# Patient Record
Sex: Male | Born: 1970 | Race: White | Hispanic: No | Marital: Married | Smoking: Never smoker
Health system: Southern US, Community
[De-identification: ages and names within clinical notes are randomized; demographics above are authoritative.]

## PROBLEM LIST (undated history)

## (undated) DIAGNOSIS — J9811 Atelectasis: Secondary | ICD-10-CM

## (undated) DIAGNOSIS — M1990 Unspecified osteoarthritis, unspecified site: Secondary | ICD-10-CM

## (undated) DIAGNOSIS — M109 Gout, unspecified: Secondary | ICD-10-CM

## (undated) DIAGNOSIS — K219 Gastro-esophageal reflux disease without esophagitis: Secondary | ICD-10-CM

## (undated) DIAGNOSIS — Z87442 Personal history of urinary calculi: Secondary | ICD-10-CM

## (undated) DIAGNOSIS — T7840XA Allergy, unspecified, initial encounter: Secondary | ICD-10-CM

## (undated) HISTORY — PX: NO PAST SURGERIES: SHX2092

---

## 2004-01-09 ENCOUNTER — Emergency Department: Payer: Self-pay | Admitting: Emergency Medicine

## 2004-03-10 ENCOUNTER — Encounter: Admission: RE | Admit: 2004-03-10 | Discharge: 2004-03-10 | Payer: Self-pay | Admitting: Family Medicine

## 2007-12-01 ENCOUNTER — Encounter: Admission: RE | Admit: 2007-12-01 | Discharge: 2007-12-01 | Payer: Self-pay | Admitting: Family Medicine

## 2008-12-30 ENCOUNTER — Encounter: Admission: RE | Admit: 2008-12-30 | Discharge: 2008-12-30 | Payer: Self-pay | Admitting: Family Medicine

## 2013-03-06 ENCOUNTER — Ambulatory Visit: Payer: Self-pay | Admitting: Orthopedic Surgery

## 2016-02-14 ENCOUNTER — Emergency Department: Payer: BLUE CROSS/BLUE SHIELD

## 2016-02-14 ENCOUNTER — Emergency Department
Admission: EM | Admit: 2016-02-14 | Discharge: 2016-02-14 | Disposition: A | Discharge status: Home / Self Care | Payer: BLUE CROSS/BLUE SHIELD | Attending: Emergency Medicine | Admitting: Emergency Medicine

## 2016-02-14 DIAGNOSIS — N201 Calculus of ureter: Secondary | ICD-10-CM | POA: Diagnosis not present

## 2016-02-14 DIAGNOSIS — R109 Unspecified abdominal pain: Secondary | ICD-10-CM

## 2016-02-14 DIAGNOSIS — Z79899 Other long term (current) drug therapy: Secondary | ICD-10-CM | POA: Diagnosis not present

## 2016-02-14 HISTORY — DX: Gastro-esophageal reflux disease without esophagitis: K21.9

## 2016-02-14 LAB — COMPREHENSIVE METABOLIC PANEL
ALT: 48 U/L (ref 17–63)
AST: 27 U/L (ref 15–41)
Albumin: 4 g/dL (ref 3.5–5.0)
Alkaline Phosphatase: 67 U/L (ref 38–126)
Anion gap: 8 (ref 5–15)
BUN: 12 mg/dL (ref 6–20)
CHLORIDE: 106 mmol/L (ref 101–111)
CO2: 26 mmol/L (ref 22–32)
CREATININE: 0.96 mg/dL (ref 0.61–1.24)
Calcium: 8.8 mg/dL — ABNORMAL LOW (ref 8.9–10.3)
GFR calc non Af Amer: >60 mL/min (ref >60)
Glucose, Bld: 120 mg/dL — ABNORMAL HIGH (ref 65–99)
Potassium: 3.3 mmol/L — ABNORMAL LOW (ref 3.5–5.1)
SODIUM: 140 mmol/L (ref 135–145)
Total Bilirubin: 0.9 mg/dL (ref 0.3–1.2)
Total Protein: 7.4 g/dL (ref 6.5–8.1)

## 2016-02-14 LAB — URINALYSIS, COMPLETE (UACMP) WITH MICROSCOPIC
BILIRUBIN URINE: NEGATIVE
Bacteria, UA: NONE SEEN
GLUCOSE, UA: NEGATIVE mg/dL
KETONES UR: NEGATIVE mg/dL
Nitrite: NEGATIVE
PH: 5 (ref 5.0–8.0)
Protein, ur: 30 mg/dL — AB
Specific Gravity, Urine: 1.028 (ref 1.005–1.030)

## 2016-02-14 LAB — CBC
HCT: 43.4 % (ref 40.0–52.0)
Hemoglobin: 15.4 g/dL (ref 13.0–18.0)
MCH: 33.3 pg (ref 26.0–34.0)
MCHC: 35.5 g/dL (ref 32.0–36.0)
MCV: 93.9 fL (ref 80.0–100.0)
PLATELETS: 194 10*3/uL (ref 150–440)
RBC: 4.62 MIL/uL (ref 4.40–5.90)
RDW: 13.3 % (ref 11.5–14.5)
WBC: 7.8 10*3/uL (ref 3.8–10.6)

## 2016-02-14 LAB — LIPASE, BLOOD: Lipase: 28 U/L (ref 11–51)

## 2016-02-14 MED ORDER — ONDANSETRON HCL 4 MG/2ML IJ SOLN
INTRAMUSCULAR | Status: AC
  Filled 2016-02-14: qty 2

## 2016-02-14 MED ORDER — KETOROLAC TROMETHAMINE 30 MG/ML IJ SOLN
INTRAMUSCULAR | Status: AC
  Filled 2016-02-14: qty 1

## 2016-02-14 MED ORDER — OXYCODONE-ACETAMINOPHEN 5-325 MG PO TABS: 1.0000 | ORAL_TABLET | Freq: Four times a day (QID) | ORAL | 0 refills | Status: AC | PRN

## 2016-02-14 MED ORDER — HYDROMORPHONE HCL 1 MG/ML IJ SOLN
1.0000 mg | Freq: Once | INTRAMUSCULAR | Status: AC
  Administered 2016-02-14: 1 mg via INTRAVENOUS
  Filled 2016-02-14: qty 1

## 2016-02-14 MED ORDER — MORPHINE SULFATE (PF) 4 MG/ML IV SOLN
INTRAVENOUS | Status: AC
  Filled 2016-02-14: qty 1

## 2016-02-14 MED ORDER — SODIUM CHLORIDE 0.9 % IV SOLN
1000.0000 mL | Freq: Once | INTRAVENOUS | Status: AC
  Administered 2016-02-14: 1000 mL via INTRAVENOUS

## 2016-02-14 MED ORDER — NAPROXEN 500 MG PO TABS: 500.0000 mg | ORAL_TABLET | Freq: Two times a day (BID) | ORAL | 2 refills | Status: DC

## 2016-02-14 MED ORDER — ONDANSETRON HCL 4 MG/2ML IJ SOLN
4.0000 mg | Freq: Once | INTRAMUSCULAR | Status: AC
  Administered 2016-02-14: 4 mg via INTRAVENOUS

## 2016-02-14 MED ORDER — KETOROLAC TROMETHAMINE 30 MG/ML IJ SOLN
30.0000 mg | Freq: Once | INTRAMUSCULAR | Status: AC
  Administered 2016-02-14: 30 mg via INTRAVENOUS

## 2016-02-14 MED ORDER — MORPHINE SULFATE (PF) 4 MG/ML IV SOLN
4.0000 mg | Freq: Once | INTRAVENOUS | Status: AC
  Administered 2016-02-14: 4 mg via INTRAVENOUS

## 2016-02-14 MED ORDER — ONDANSETRON HCL 4 MG PO TABS: 4.0000 mg | ORAL_TABLET | Freq: Every day | ORAL | 1 refills | Status: DC | PRN

## 2016-02-14 MED ORDER — TAMSULOSIN HCL 0.4 MG PO CAPS: 0.4000 mg | ORAL_CAPSULE | Freq: Every day | ORAL | 0 refills | Status: DC

## 2016-02-14 NOTE — ED Triage Notes (Signed)
Per EMS: Pt c/o LRQ pain radiating to groin. Pt states: "It feels like I got kicked in the balls". Pt attempted to drive to hospital and stopped at EMS center

## 2016-02-14 NOTE — ED Provider Notes (Signed)
[facility] Emergency Department Provider Note   ____________________________________________    I have reviewed the triage vital signs and the nursing notes.   HISTORY  Chief Complaint Abdominal Pain     HPI Tracy Villarreal is a 45 y.o. male who presents with complaints of right sided flank pain that radiates into his right groin. He reports his started around noon yesterday and has been nearly constant. He reports the pain has become so severe that he was unable to drive to the emergency department today. He has had nausea but not currently. No history of abdominal surgeries. No history of kidney stones. No dysuria. No fevers or chills.   Past Medical History:  Diagnosis Date  . GERD (gastroesophageal reflux disease)     There are no active problems to display for this patient.   History reviewed. No pertinent surgical history.  Prior to Admission medications   Medication Sig Start Date End Date Taking? Authorizing Provider  naproxen (NAPROSYN) 500 MG tablet Take 1 tablet (500 mg total) by mouth 2 (two) times daily with a meal. 02/14/16   Jene Everyobert Zannie Runkle, MD  ondansetron (ZOFRAN) 4 MG tablet Take 1 tablet (4 mg total) by mouth daily as needed for nausea or vomiting. 02/14/16   Jene Everyobert Shanon Becvar, MD  oxyCODONE-acetaminophen (ROXICET) 5-325 MG tablet Take 1 tablet by mouth every 6 (six) hours as needed. 02/14/16 02/13/17  Jene Everyobert Zaedyn Covin, MD  tamsulosin (FLOMAX) 0.4 MG CAPS capsule Take 1 capsule (0.4 mg total) by mouth daily. 02/14/16   Jene Everyobert Juergen Hardenbrook, MD     Allergies Patient has no known allergies.  No family history on file.  Social History Social History  Substance Use Topics  . Smoking status: Never Smoker  . Smokeless tobacco: Never Used  . Alcohol use 4.8 oz/week    8 Shots of liquor per week    Review of Systems  Constitutional: No fever/chills  Cardiovascular: Denies chest pain. Respiratory: Denies shortness of  breath. Gastrointestinal:As above Genitourinary: Negative for dysuria. Musculoskeletal: Negative for back pain. Skin: Negative for rash. Neurological: Negative for headaches  10-point ROS otherwise negative.  ____________________________________________   PHYSICAL EXAM:  VITAL SIGNS: ED Triage Vitals  Enc Vitals Group     BP 02/14/16 0408 (!) 150/98     Pulse Rate 02/14/16 0408 85     Resp --      Temp 02/14/16 0408 98.2 F (36.8 C)     Temp Source 02/14/16 0408 Oral     SpO2 02/14/16 0406 98 %     Weight 02/14/16 0410 215 lb (97.5 kg)     Height 02/14/16 0410 5\' 11"  (1.803 m)     Head Circumference --      Peak Flow --      Pain Score 02/14/16 0411 10     Pain Loc --      Pain Edu? --      Excl. in GC? --     Constitutional: Alert and oriented. Uncomfortable but in no acute distress Eyes: Conjunctivae are normal.   Nose: No congestion/rhinnorhea. Mouth/Throat: Mucous membranes are moist.    Cardiovascular: Normal rate, regular rhythm. Grossly normal heart sounds.  Good peripheral circulation. Respiratory: Normal respiratory effort.  No retractions. Lungs CTAB. Gastrointestinal: Soft and nontender. No distention.  No CVA tenderness. Genitourinary: deferred Musculoskeletal: No lower extremity tenderness nor edema.  Warm and well perfused Neurologic:  Normal speech and language. No gross focal neurologic deficits are appreciated.  Skin:  Skin  is warm, dry and intact. No rash noted. Psychiatric: Mood and affect are normal. Speech and behavior are normal.  ____________________________________________   LABS (all labs ordered are listed, but only abnormal results are displayed)  Labs Reviewed  COMPREHENSIVE METABOLIC PANEL - Abnormal; Notable for the following:       Result Value   Potassium 3.3 (*)    Glucose, Bld 120 (*)    Calcium 8.8 (*)    All other components within normal limits  URINALYSIS, COMPLETE (UACMP) WITH MICROSCOPIC - Abnormal; Notable for the  following:    Color, Urine YELLOW (*)    APPearance CLEAR (*)    Hgb urine dipstick SMALL (*)    Protein, ur 30 (*)    Leukocytes, UA TRACE (*)    Squamous Epithelial / LPF 0-5 (*)    All other components within normal limits  CBC  LIPASE, BLOOD   ____________________________________________  EKG  None ____________________________________________  RADIOLOGY  4 mm UVJ stone ____________________________________________   PROCEDURES  Procedure(s) performed: No    Critical Care performed: No ____________________________________________   INITIAL IMPRESSION / ASSESSMENT AND PLAN / ED COURSE  Pertinent labs & imaging results that were available during my care of the patient were reviewed by me and considered in my medical decision making (see chart for details).  Patient presents with right-sided abdominal pain radiating into his groin, I suspect ureterolithiasis. Appendicitis is also certainly possible. We will give IV Toradol and fluids. Obtain CT renal stone study and blood work and urinalysis and reevaluate  Clinical Course    ----------------------------------------- 6:22 AM on 02/14/2016 -----------------------------------------  Patient came in having pain, he was given IV morphine and IV Zofran. Even this did not significantly alleviate his pain, IV Dilaudid given which brought his pain from a 10 to a 5 or 6.however he reports it seems to be coming back again, he may require admission for pain control and urology consultation ____________________________________________   Patient reports he felt better after urinating, he is not interested in admission. He wants to go home with pain medications so I feel this is reasonable, normal white blood cell count, no fever no bacteria seen in urine. Return precautions discussed at length.  FINAL CLINICAL IMPRESSION(S) / ED DIAGNOSES  Final diagnoses:  Right flank pain  Ureterolithiasis      NEW MEDICATIONS STARTED  DURING THIS VISIT:  Discharge Medication List as of 02/14/2016  6:26 AM    START taking these medications   Details  naproxen (NAPROSYN) 500 MG tablet Take 1 tablet (500 mg total) by mouth 2 (two) times daily with a meal., Starting Sat 02/14/2016, Print    ondansetron (ZOFRAN) 4 MG tablet Take 1 tablet (4 mg total) by mouth daily as needed for nausea or vomiting., Starting Sat 02/14/2016, Print    oxyCODONE-acetaminophen (ROXICET) 5-325 MG tablet Take 1 tablet by mouth every 6 (six) hours as needed., Starting Sat 02/14/2016, Until Sun 02/13/2017, Print    tamsulosin (FLOMAX) 0.4 MG CAPS capsule Take 1 capsule (0.4 mg total) by mouth daily., Starting Sat 02/14/2016, Print         Note:  This document was prepared using Dragon voice recognition software and may include unintentional dictation errors.    Jene Everyobert Eylin Pontarelli, MD 02/15/16 [phone]

## 2016-02-17 ENCOUNTER — Encounter: Payer: Self-pay | Admitting: Urology

## 2016-02-17 ENCOUNTER — Ambulatory Visit (INDEPENDENT_AMBULATORY_CARE_PROVIDER_SITE_OTHER): Payer: BLUE CROSS/BLUE SHIELD | Admitting: Urology

## 2016-02-17 VITALS — BP 152/102 | HR 91 | Ht 71.0 in | Wt 212.1 lb

## 2016-02-17 DIAGNOSIS — N201 Calculus of ureter: Secondary | ICD-10-CM

## 2016-02-17 DIAGNOSIS — H9190 Unspecified hearing loss, unspecified ear: Secondary | ICD-10-CM | POA: Insufficient documentation

## 2016-02-17 DIAGNOSIS — M109 Gout, unspecified: Secondary | ICD-10-CM | POA: Insufficient documentation

## 2016-02-17 DIAGNOSIS — N1330 Unspecified hydronephrosis: Secondary | ICD-10-CM

## 2016-02-17 LAB — MICROSCOPIC EXAMINATION: Bacteria, UA: NONE SEEN

## 2016-02-17 LAB — URINALYSIS, COMPLETE
Bilirubin, UA: NEGATIVE
GLUCOSE, UA: NEGATIVE
Leukocytes, UA: NEGATIVE
NITRITE UA: NEGATIVE
PH UA: 5.5 (ref 5.0–7.5)
Specific Gravity, UA: 1.02 (ref 1.005–1.030)
UUROB: 0.2 mg/dL (ref 0.2–1.0)

## 2016-02-17 MED ORDER — HYDROCODONE-ACETAMINOPHEN 5-325 MG PO TABS: 1.0000 | ORAL_TABLET | Freq: Four times a day (QID) | ORAL | 0 refills | Status: DC | PRN

## 2016-02-17 MED ORDER — TAMSULOSIN HCL 0.4 MG PO CAPS: 0.4000 mg | ORAL_CAPSULE | Freq: Every day | ORAL | 0 refills | Status: DC

## 2016-02-17 NOTE — Progress Notes (Signed)
02/17/2016 2:25 PM   Maralyn Sagohristopher L Mcelhiney [birth year] [id number]  Referring provider: No referring provider defined for this encounter.  Chief Complaint  Patient presents with  . Nephrolithiasis    HPI: 45 year old male who presents to the office after recent emergency room visit on 02/14/2016 found to have an obstructing 4 mm right distal ureteral stone with mild hydroureteronephrosis. He initially developed severe onset right flank pain  Review of labs in the emergency room show 6-30 red blood cells as well as 6-30 white blood cells without bacteria present and his urinalysis. This was also nitrite negative.  Creatinine 0.96.  WBC 7.8.  He continues to have pain.  This is mostly well-controlled but can be severe at times. He has been using Percocet as needed.  He denies fevers or chills.  No vomiting but does have nausea.     No prior history of kidney stones.    PMH: Past Medical History:  Diagnosis Date  . GERD (gastroesophageal reflux disease)     Surgical History: History reviewed. No pertinent surgical history.  Home Medications:    Medication List       Accurate as of 02/17/16  2:25 PM. Always use your most recent med list.          HYDROcodone-acetaminophen 5-325 MG tablet Commonly known as:  NORCO/VICODIN Take 1-2 tablets by mouth every 6 (six) hours as needed for moderate pain.   naproxen 500 MG tablet Commonly known as:  NAPROSYN Take 1 tablet (500 mg total) by mouth 2 (two) times daily with a meal.   ondansetron 4 MG tablet Commonly known as:  ZOFRAN Take 1 tablet (4 mg total) by mouth daily as needed for nausea or vomiting.   oxyCODONE-acetaminophen 5-325 MG tablet Commonly known as:  ROXICET Take 1 tablet by mouth every 6 (six) hours as needed.   tamsulosin 0.4 MG Caps capsule Commonly known as:  FLOMAX Take 1 capsule (0.4 mg total) by mouth daily.       Allergies: No Known Allergies  Family History: Family History  Problem Relation  Age of Onset  . Prostate cancer Neg Hx   . Bladder Cancer Neg Hx   . Kidney cancer Neg Hx     Social History:  reports that he has never smoked. He has never used smokeless tobacco. He reports that he drinks about 4.8 oz of alcohol per week . He reports that he does not use drugs.  ROS: UROLOGY Frequent Urination?: No Hard to postpone urination?: No Burning/pain with urination?: No Get up at night to urinate?: Yes Leakage of urine?: No Urine stream starts and stops?: Yes Trouble starting stream?: Yes Do you have to strain to urinate?: No Blood in urine?: No Urinary tract infection?: No Sexually transmitted disease?: No Injury to kidneys or bladder?: No Painful intercourse?: No Weak stream?: No Erection problems?: No Penile pain?: No  Gastrointestinal Nausea?: Yes Vomiting?: No Indigestion/heartburn?: No Diarrhea?: No Constipation?: No  Constitutional Fever: No Night sweats?: Yes Weight loss?: No Fatigue?: No  Skin Skin rash/lesions?: No Itching?: No  Eyes Blurred vision?: No Double vision?: No  Ears/Nose/Throat Sore throat?: No Sinus problems?: No  Hematologic/Lymphatic Swollen glands?: No Easy bruising?: No  Cardiovascular Leg swelling?: No Chest pain?: No  Respiratory Cough?: Yes Shortness of breath?: No  Endocrine Excessive thirst?: No  Musculoskeletal Back pain?: No Joint pain?: No  Neurological Headaches?: No Dizziness?: No  Psychologic Depression?: No Anxiety?: No  Physical Exam: BP (!) 152/102 (BP Location: Right Arm,  Patient Position: Sitting, Cuff Size: Normal)   Pulse 91   Ht 5\' 11"  (1.803 m)   Wt 212 lb 1.6 oz (96.2 kg)   BMI 29.58 kg/m   Constitutional:  Alert and oriented, No acute distress. HEENT: [place] AT, moist mucus membranes.  Trachea midline, no masses. Cardiovascular: No clubbing, cyanosis, or edema. Respiratory: Normal respiratory effort, no increased work of breathing. GI: Abdomen is soft,  nondistended, no  abdominal masses, mild tenderness in the right lower quadrant. GU: No CVA tenderness.  Skin: No rashes, bruises or suspicious lesions. Neurologic: Grossly intact, no focal deficits, moving all 4 extremities. Psychiatric: Normal mood and affect.  Laboratory Data: Lab Results  Component Value Date   WBC 7.8 02/14/2016   HGB 15.4 02/14/2016   HCT 43.4 02/14/2016   MCV 93.9 02/14/2016   PLT 194 02/14/2016    Lab Results  Component Value Date   CREATININE 0.96 02/14/2016    Urinalysis Results for orders placed or performed in visit on 02/17/16  Microscopic Examination  Result Value Ref Range   WBC, UA 11-30 (A) 0 - 5 /hpf   RBC, UA 11-30 (A) 0 - 2 /hpf   Epithelial Cells (non renal) 0-10 0 - 10 /hpf   Casts Present (A) None seen /lpf   Cast Type Hyaline casts N/A   Crystals Present (A) N/A   Crystal Type Amorphous Sediment N/A   Mucus, UA Present (A) Not Estab.   Bacteria, UA None seen None seen/Few  Urinalysis, Complete  Result Value Ref Range   Specific Gravity, UA 1.020 1.005 - 1.030   pH, UA 5.5 5.0 - 7.5   Color, UA Yellow Yellow   Appearance Ur Cloudy (A) Clear   Leukocytes, UA Negative Negative   Protein, UA 1+ (A) Negative/Trace   Glucose, UA Negative Negative   Ketones, UA 2+ (A) Negative   RBC, UA 3+ (A) Negative   Bilirubin, UA Negative Negative   Urobilinogen, Ur 0.2 0.2 - 1.0 mg/dL   Nitrite, UA Negative Negative   Microscopic Examination See below:     Pertinent Imaging: CLINICAL DATA:  Initial evaluation for acute right flank pain radiating to groin.  EXAM: CT ABDOMEN AND PELVIS WITHOUT CONTRAST  TECHNIQUE: Multidetector CT imaging of the abdomen and pelvis was performed following the standard protocol without IV contrast.  COMPARISON:  None available.  FINDINGS: Lower chest: Minimal hazy atelectatic changes present within the visualized lung bases. Visualized lungs are otherwise clear.  Hepatobiliary: Few scattered subcentimeter  hypodense lesions noted within the liver, indeterminate, but may reflect small cyst. Hepatic steatosis noted. Liver otherwise unremarkable. Gallbladder within normal limits. No biliary dilatation.  Pancreas: Pancreas within normal limits.  Spleen: Spleen within normal limits.  Adrenals/Urinary Tract: Adrenal glands are normal. Left kidney unremarkable without evidence for nephrolithiasis or hydronephrosis. No radiopaque calculi seen along the course of the left renal collecting system. No left-sided hydroureter.  On the right, there is a 4 mm obstructive stone positioned just proximal to the right UVJ with secondary mild right hydroureteronephrosis. Mild hazy inflammatory stranding about the right ureter. No other stone seen along the course of the right ureter. Additional probable punctate calculus noted within the interpolar right kidney.  Bladder largely decompressed without acute abnormality.  Stomach/Bowel: Stomach within normal limits. No evidence for bowel obstruction. Appendix within normal limits. No acute inflammatory changes seen about the bowels.  Vascular/Lymphatic: Mild aorto bi-iliac atherosclerotic disease. No aneurysm. No adenopathy.  Reproductive: Prostate within normal limits.  Other:  No free air or fluid.  Musculoskeletal: No acute osseous abnormality. No worrisome lytic or blastic osseous lesions.  IMPRESSION: 1. 4 mm obstructive stone within the distal right ureter just proximal to the right UVJ with secondary mild right hydroureteronephrosis. 2. Probable additional punctate nonobstructive right renal calculus. 3. No other acute intra-abdominal or pelvic process. 4. Hepatic steatosis.   Electronically Signed   By: Rise Mu M.D.   On: 02/14/2016 04:42  CT scan personally reviewed today and with the patient.  Assessment & Plan:  45 year old male with a 4 mm right UVJ with proximal hydroureteronephrosis.  No  leukocytosis, elevated creatinine. UA some borderline but asymptomatic for UTI.  Clinically appears well.  1. Right ureteral stone We discussed given the size and location of the stone, he is an excellent probability of passing the stone without intervention. Alternatives including shockwave lithotripsy and ureteroscopy were discussed. Risk and benefits of each were discussed.  He would like to continue medical expulsive therapy. Refills of Flomax and Vicodin were given. He was also given a urinary strainer and advised the importance of using this regularly to catch the stone.  Warning symptoms were reviewed in detail. He will present to emergency room if this pain fails to be controlled or he develops fevers chills or any urinary symptoms.  We discussed general stone prevention techniques including drinking plenty water with goal of producing 2.5 L urine daily, increased citric acid intake, avoidance of high oxalate containing foods, and decreased salt intake.  Information about dietary recommendations given today.   All of his questions were answered.  2. Hydronephrosis, right Secondary to #1  Return in about 2 weeks (around 03/02/2016) for 2 weeks with KUB Carollee Herter or myself).  Vanna Scotland, MD  [facility] [address], [address] [place], [place] [address] [phone]

## 2016-02-17 NOTE — Patient Instructions (Signed)
Dietary Guidelines to Help Prevent Kidney Stones Your risk of kidney stones can be decreased by adjusting the foods you eat. The most important thing you can do is drink enough fluid. You should drink enough fluid to keep your urine clear or pale yellow. The following guidelines provide specific information for the type of kidney stone you have had. Guidelines according to type of kidney stone Calcium Oxalate Kidney Stones  Reduce the amount of salt you eat. Foods that have a lot of salt cause your body to release excess calcium into your urine. The excess calcium can combine with a substance called oxalate to form kidney stones.  Reduce the amount of animal protein you eat if the amount you eat is excessive. Animal protein causes your body to release excess calcium into your urine. Ask your dietitian how much protein from animal sources you should be eating.  Avoid foods that are high in oxalates. If you take vitamins, they should have less than 500 mg of vitamin C. Your body turns vitamin C into oxalates. You do not need to avoid fruits and vegetables high in vitamin C. Calcium Phosphate Kidney Stones  Reduce the amount of salt you eat to help prevent the release of excess calcium into your urine.  Reduce the amount of animal protein you eat if the amount you eat is excessive. Animal protein causes your body to release excess calcium into your urine. Ask your dietitian how much protein from animal sources you should be eating.  Get enough calcium from food or take a calcium supplement (ask your dietitian for recommendations). Food sources of calcium that do not increase your risk of kidney stones include:  Broccoli.  Dairy products, such as cheese and yogurt.  Pudding. Uric Acid Kidney Stones  Do not have more than 6 oz of animal protein per day. Food sources Animal Protein Sources  Meat (all types).  Poultry.  Eggs.  Fish, seafood. Foods High in Salt  Salt seasonings.  Soy  sauce.  Teriyaki sauce.  Cured and processed meats.  Salted crackers and snack foods.  Fast food.  Canned soups and most canned foods. Foods High in Oxalates  Grains:  Amaranth.  Barley.  Grits.  Wheat germ.  Bran.  Buckwheat flour.  All bran cereals.  Pretzels.  Whole wheat bread.  Vegetables:  Beans (wax).  Beets and beet greens.  Collard greens.  Eggplant.  Escarole.  Leeks.  Okra.  Parsley.  Rutabagas.  Spinach.  Swiss chard.  Tomato paste.  Fried potatoes.  Sweet potatoes.  Fruits:  Red currants.  Figs.  Kiwi.  Rhubarb.  Meat and Other Protein Sources:  Beans (dried).  Soy burgers and other soybean products.  Miso.  Nuts (peanuts, almonds, pecans, cashews, hazelnuts).  Nut butters.  Sesame seeds and tahini (paste made of sesame seeds).  Poppy seeds.  Beverages:  Chocolate drink mixes.  Soy milk.  Instant iced tea.  Juices made from high-oxalate fruits or vegetables.  Other:  Carob.  Chocolate.  Fruitcake.  Marmalades. This information is not intended to replace advice given to you by your health care provider. Make sure you discuss any questions you have with your health care provider. Document Released: 06/19/2010 Document Revised: 07/31/2015 Document Reviewed: 01/19/2013 Elsevier Interactive Patient Education  2017 [employer] Inc.  

## 2016-02-19 LAB — CULTURE, URINE COMPREHENSIVE

## 2016-02-20 ENCOUNTER — Ambulatory Visit: Payer: Self-pay | Admitting: Urology

## 2016-03-11 ENCOUNTER — Ambulatory Visit: Payer: BLUE CROSS/BLUE SHIELD | Admitting: Urology

## 2017-03-14 ENCOUNTER — Ambulatory Visit
Admission: RE | Admit: 2017-03-14 | Discharge: 2017-03-14 | Disposition: A | Discharge status: Home / Self Care | Payer: BLUE CROSS/BLUE SHIELD | Source: Ambulatory Visit | Attending: Family Medicine | Admitting: Family Medicine

## 2017-03-14 ENCOUNTER — Other Ambulatory Visit: Payer: Self-pay | Admitting: Family Medicine

## 2017-03-14 DIAGNOSIS — R52 Pain, unspecified: Secondary | ICD-10-CM

## 2017-12-01 ENCOUNTER — Encounter: Payer: Self-pay | Admitting: Surgery

## 2017-12-01 ENCOUNTER — Encounter: Payer: Self-pay | Admitting: *Deleted

## 2017-12-01 ENCOUNTER — Ambulatory Visit (INDEPENDENT_AMBULATORY_CARE_PROVIDER_SITE_OTHER): Payer: BLUE CROSS/BLUE SHIELD | Admitting: Surgery

## 2017-12-01 VITALS — BP 153/97 | HR 76 | Temp 97.9°F | Resp 18 | Ht 70.5 in | Wt 221.2 lb

## 2017-12-01 DIAGNOSIS — K429 Umbilical hernia without obstruction or gangrene: Secondary | ICD-10-CM | POA: Diagnosis not present

## 2017-12-01 NOTE — Patient Instructions (Signed)
You have requested for your Umbilical Hernia be repaired. This has been scheduled with  Dr. Satira Mccallum at [facility]. Please see date listed on surgery form.    You will need to arrange to be off work for 1-2 weeks but will have to have a lifting restriction of no more than 15 lbs for 6 weeks following your surgery.   Umbilical Hernia, Adult A hernia is a bulge of tissue that pushes through an opening between muscles. An umbilical hernia happens in the abdomen, near the belly button (umbilicus). The hernia may contain tissues from the small intestine, large intestine, or fatty tissue covering the intestines (omentum). Umbilical hernias in adults tend to get worse over time, and they require surgical treatment. There are several types of umbilical hernias. You may have:  A hernia located just above or below the umbilicus (indirect hernia). This is the most common type of umbilical hernia in adults.  A hernia that forms through an opening formed by the umbilicus (direct hernia).  A hernia that comes and goes (reducible hernia). A reducible hernia may be visible only when you strain, lift something heavy, or cough. This type of hernia can be pushed back into the abdomen (reduced).  A hernia that traps abdominal tissue inside the hernia (incarcerated hernia). This type of hernia cannot be reduced.  A hernia that cuts off blood flow to the tissues inside the hernia (strangulated hernia). The tissues can start to die if this happens. This type of hernia requires emergency treatment.  What are the causes? An umbilical hernia happens when tissue inside the abdomen presses on a weak area of the abdominal muscles. What increases the risk? You may have a greater risk of this condition if you:  Are obese.  Have had several pregnancies.  Have a buildup of fluid inside your abdomen (ascites).  Have had surgery that weakens the abdominal muscles.  What are the signs or symptoms? The main  symptom of this condition is a painless bulge at or near the belly button. A reducible hernia may be visible only when you strain, lift something heavy, or cough. Other symptoms may include:  Dull pain.  A feeling of pressure.  Symptoms of a strangulated hernia may include:  Pain that gets increasingly worse.  Nausea and vomiting.  Pain when pressing on the hernia.  Skin over the hernia becoming red or purple.  Constipation.  Blood in the stool.  How is this diagnosed? This condition may be diagnosed based on:  A physical exam. You may be asked to cough or strain while standing. These actions increase the pressure inside your abdomen and force the hernia through the opening in your muscles. Your health care provider may try to reduce the hernia by pressing on it.  Your symptoms and medical history.  How is this treated? Surgery is the only treatment for an umbilical hernia. Surgery for a strangulated hernia is done as soon as possible. If you have a small hernia that is not incarcerated, you may need to lose weight before having surgery. Follow these instructions at home:  Lose weight, if told by your health care provider.  Do not try to push the hernia back in.  Watch your hernia for any changes in color or size. Tell your health care provider if any changes occur.  You may need to avoid activities that increase pressure on your hernia.  Do not lift anything that is heavier than 10 lb (4.5 kg) until your health  care provider says that this is safe.  Take over-the-counter and prescription medicines only as told by your health care provider.  Keep all follow-up visits as told by your health care provider. This is important. Contact a health care provider if:  Your hernia gets larger.  Your hernia becomes painful. Get help right away if:  You develop sudden, severe pain near the area of your hernia.  You have pain as well as nausea or vomiting.  You have pain and  the skin over your hernia changes color.  You develop a fever. This information is not intended to replace advice given to you by your health care provider. Make sure you discuss any questions you have with your health care provider. Document Released: 07/25/2015 Document Revised: 10/26/2015 Document Reviewed: 07/25/2015 Elsevier Interactive Patient Education  [employer].

## 2017-12-01 NOTE — H&P (View-Only) (Signed)
Surgical Clinic History and Physical  Referring provider:  No referring provider defined for this encounter.  HISTORY OF PRESENT ILLNESS (HPI):  Tracy Villarreal presents for evaluation of his umbilical hernia. Patient reports he first noticed some burning umbilical pain 1 month ago, shortly after which he coughed while watching tv with his hand resting on his umbilicus and noticed his fingers rise on his umbilicus. Since that time, he's noticed an increasingly painful umbilical "bulge". 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 He mentioned this accordingly to his primary care physician and is referred for repair of his increasingly symptomatic umbilical hernia. Patient says his hernia continues to cause him moderate pain, particularly when lifting heavy machinery at work, and he expresses much anxiety about his worsening pain and the possibility of his hernia enlarging and causing him further problems. Patient otherwise reports +flatus and denies constipation, straining with urination, recent weight loss or weight gain, or frequent coughing.  PAST MEDICAL HISTORY (PMH):  Past Medical History:  Diagnosis Date  . GERD (gastroesophageal reflux disease)      PAST SURGICAL HISTORY (PSH):  History reviewed. No pertinent surgical history.   MEDICATIONS:  Prior to Admission medications   Medication Sig Start Date End Date Taking? Authorizing Provider  fluticasone (FLONASE) 50 MCG/ACT nasal spray SHAKE LQ AND U 1 TO 2 SPRAYS IEN QD PRF ALLERGIES 11/28/17  Yes [provider]     ALLERGIES:  No Known Allergies   SOCIAL HISTORY:  Social History   Socioeconomic History  . Marital status: Married    Spouse name: Not on file  . Number of children: Not on file  . Years of education: Not on file  .  Highest education level: Not on file  Occupational History  . Not on file  Social Needs  . Financial resource strain: Not on file  . Food insecurity:    Worry: Not on file    Inability: Not on file  . Transportation needs:    Medical: Not on file    Non-medical: Not on file  Tobacco Use  . Smoking status: Never Smoker  . Smokeless tobacco: Never Used  Substance and Sexual Activity  . Alcohol use: Yes    Alcohol/week: 8.0 standard drinks    Types: 8 Shots of liquor per week  . Drug use: No  . Sexual activity: Not on file  Lifestyle  . Physical activity:    Days per week: Not on file    Minutes per session: Not on file  . Stress: Not on file  Relationships  . Social connections:    Talks on phone: Not on file    Gets together: Not on file    Attends religious service: Not on file    Active member of club or organization: Not on file    Attends meetings of clubs or organizations: Not on file    Relationship status: Not on file  . Intimate partner violence:    Fear of current or ex partner: Not on file    Emotionally abused: Not on file    Physically abused: Not on file    Forced sexual activity: Not on file  Other Topics Concern  . Not on file  Social History Narrative  . Not on file    The patient currently resides (home /   rehab facility / nursing home): Home The patient normally is (ambulatory / bedbound): Ambulatory  FAMILY HISTORY:  Family History  Problem Relation Age of Onset  . Skin cancer Mother   . Cancer Sister   . Cancer Paternal Grandfather   . Prostate cancer Neg Hx   . Bladder Cancer Neg Hx   . Kidney cancer Neg Hx     Otherwise negative/non-contributory.  REVIEW OF SYSTEMS:  Constitutional: denies any other weight loss, fever, chills, or sweats  Eyes: denies any other vision changes, history of eye injury  ENT: denies sore throat, hearing problems  Respiratory: denies shortness of breath, wheezing  Cardiovascular: denies chest pain,  palpitations  Gastrointestinal: abdominal pain, N/V, and bowel function as per HPI Musculoskeletal: denies any other joint pains or cramps  Skin: Denies any other rashes or skin discolorations Neurological: denies any other headache, dizziness, weakness  Psychiatric: Denies any other depression, anxiety   All other review of systems were otherwise negative   VITAL SIGNS:  BP (!) 153/97   Pulse 76   Temp 97.9 F (36.6 C) (Temporal)   Resp 18   Ht 5' 10.5" (1.791 m)   Wt 221 lb 3.2 oz (100.3 kg)   SpO2 95%   BMI 31.29 kg/m    PHYSICAL EXAM:  Constitutional:  -- Overweight body habitus  -- Awake, alert, and oriented x3  Eyes:  -- Pupils equally round and reactive to light  -- No scleral icterus  Ear, nose, throat:  -- No jugular venous distension -- No nasal drainage, bleeding Pulmonary:  -- No crackles  -- Equal breath sounds bilaterally -- Breathing non-labored at rest Cardiovascular:  -- S1, S2 present  -- No pericardial rubs  Gastrointestinal:  -- Abdomen soft and non-distended with no guarding/rebound tenderness  -- Easily reducible and spontaneously self-reducing, but tender to palpation, umbilical hernia -- No other abdominal masses appreciated, pulsatile or otherwise  Musculoskeletal and Integumentary:  -- Wounds or skin discoloration: None appreciated -- Extremities: B/L UE and LE FROM, hands and feet warm, no edema  Neurologic:  -- Motor function: Intact and symmetric -- Sensation: Intact and symmetric  Labs:  CBC:  Lab Results  Component Value Date   WBC 7.8 02/14/2016   RBC 4.62 02/14/2016   BMP:  Lab Results  Component Value Date   GLUCOSE 120 (H) 02/14/2016   CO2 26 02/14/2016   BUN 12 02/14/2016   CREATININE 0.96 02/14/2016   CALCIUM 8.8 (L) 02/14/2016     Imaging studies: No new pertinent imaging studies available for review   Assessment/Plan: (ICD-10's: K42.9) Tracy Villarreal with increasingly asymptomatic easily reducible  supra-umbilical hernia, complicated by pertinent comorbidities including obesity (BMI >31) and GERD.               - discussed with patient signs and symptoms of hernia incarceration and obstruction             - strategies for manual self-reduction of patient's umbilical hernia also reviewed and discussed  - maintain hydration with high fiber heart healthy diet to reduce/minimize constipation +/- daily stool softener as needed             - all risks, benefits, and alternatives to open repair of increasingly symptomatic supra-umbilical hernia with mesh were discussed with the patient, all of his questions were answered to patient's expressed satisfaction, patient expresses he wishes to proceed, and informed consent was obtained.             -   will plan for open repair of supra-umbilical hernia with mesh next week/on 10/4 per patient request             - anticipate return to clinic 2 weeks after above planned surgery             - instructed to call if any questions or concerns  All of the above recommendations were discussed with the patient, and all of patient's questions were answered to his expressed satisfaction.  Thank you for the opportunity to participate in this patient's care.  -- Nicol Herbig E. Callicott, MD, RPVI [place]: [facility] General Surgery - Partnering for exceptional care. Office: [phone] 

## 2017-12-01 NOTE — Progress Notes (Signed)
Patient's surgery has been scheduled for 12-09-17 at [facility] with Dr. Earlene Plater.   He is aware that the Pre-admission Testing Department will be calling on 12-05-17 in the afternoon to complete a phone interview.   The patient is aware to call the office if he has further questions.

## 2017-12-01 NOTE — Progress Notes (Signed)
Surgical Clinic History and Physical  Referring provider:  No referring provider defined for this encounter.  HISTORY OF PRESENT ILLNESS (HPI):  47 y.o. male presents for evaluation of his umbilical hernia. Patient reports he first noticed some burning umbilical pain 1 month ago, shortly after which he coughed while watching tv with his hand resting on his umbilicus and noticed his fingers rise on his umbilicus. Since that time, he's noticed an increasingly painful umbilical "bulge". 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 He mentioned this accordingly to his primary care physician and is referred for repair of his increasingly symptomatic umbilical hernia. Patient says his hernia continues to cause him moderate pain, particularly when lifting heavy machinery at work, and he expresses much anxiety about his worsening pain and the possibility of his hernia enlarging and causing him further problems. Patient otherwise reports +flatus and denies constipation, straining with urination, recent weight loss or weight gain, or frequent coughing.  PAST MEDICAL HISTORY (PMH):  Past Medical History:  Diagnosis Date  . GERD (gastroesophageal reflux disease)      PAST SURGICAL HISTORY (PSH):  History reviewed. No pertinent surgical history.   MEDICATIONS:  Prior to Admission medications   Medication Sig Start Date End Date Taking? Authorizing Provider  fluticasone (FLONASE) 50 MCG/ACT nasal spray SHAKE LQ AND U 1 TO 2 SPRAYS IEN QD PRF ALLERGIES 11/28/17  Yes [provider]     ALLERGIES:  No Known Allergies   SOCIAL HISTORY:  Social History   Socioeconomic History  . Marital status: Married    Spouse name: Not on file  . Number of children: Not on file  . Years of education: Not on file  .  Highest education level: Not on file  Occupational History  . Not on file  Social Needs  . Financial resource strain: Not on file  . Food insecurity:    Worry: Not on file    Inability: Not on file  . Transportation needs:    Medical: Not on file    Non-medical: Not on file  Tobacco Use  . Smoking status: Never Smoker  . Smokeless tobacco: Never Used  Substance and Sexual Activity  . Alcohol use: Yes    Alcohol/week: 8.0 standard drinks    Types: 8 Shots of liquor per week  . Drug use: No  . Sexual activity: Not on file  Lifestyle  . Physical activity:    Days per week: Not on file    Minutes per session: Not on file  . Stress: Not on file  Relationships  . Social connections:    Talks on phone: Not on file    Gets together: Not on file    Attends religious service: Not on file    Active member of club or organization: Not on file    Attends meetings of clubs or organizations: Not on file    Relationship status: Not on file  . Intimate partner violence:    Fear of current or ex partner: Not on file    Emotionally abused: Not on file    Physically abused: Not on file    Forced sexual activity: Not on file  Other Topics Concern  . Not on file  Social History Narrative  . Not on file    The patient currently resides (home /  rehab facility / nursing home): Home The patient normally is (ambulatory / bedbound): Ambulatory  FAMILY HISTORY:  Family History  Problem Relation Age of Onset  . Skin cancer Mother   . Cancer Sister   . Cancer Paternal Grandfather   . Prostate cancer Neg Hx   . Bladder Cancer Neg Hx   . Kidney cancer Neg Hx     Otherwise negative/non-contributory.  REVIEW OF SYSTEMS:  Constitutional: denies any other weight loss, fever, chills, or sweats  Eyes: denies any other vision changes, history of eye injury  ENT: denies sore throat, hearing problems  Respiratory: denies shortness of breath, wheezing  Cardiovascular: denies chest pain,  palpitations  Gastrointestinal: abdominal pain, N/V, and bowel function as per HPI Musculoskeletal: denies any other joint pains or cramps  Skin: Denies any other rashes or skin discolorations Neurological: denies any other headache, dizziness, weakness  Psychiatric: Denies any other depression, anxiety   All other review of systems were otherwise negative   VITAL SIGNS:  BP (!) 153/97   Pulse 76   Temp 97.9 F (36.6 C) (Temporal)   Resp 18   Ht 5' 10.5" (1.791 m)   Wt 221 lb 3.2 oz (100.3 kg)   SpO2 95%   BMI 31.29 kg/m    PHYSICAL EXAM:  Constitutional:  -- Overweight body habitus  -- Awake, alert, and oriented x3  Eyes:  -- Pupils equally round and reactive to light  -- No scleral icterus  Ear, nose, throat:  -- No jugular venous distension -- No nasal drainage, bleeding Pulmonary:  -- No crackles  -- Equal breath sounds bilaterally -- Breathing non-labored at rest Cardiovascular:  -- S1, S2 present  -- No pericardial rubs  Gastrointestinal:  -- Abdomen soft and non-distended with no guarding/rebound tenderness  -- Easily reducible and spontaneously self-reducing, but tender to palpation, umbilical hernia -- No other abdominal masses appreciated, pulsatile or otherwise  Musculoskeletal and Integumentary:  -- Wounds or skin discoloration: None appreciated -- Extremities: B/L UE and LE FROM, hands and feet warm, no edema  Neurologic:  -- Motor function: Intact and symmetric -- Sensation: Intact and symmetric  Labs:  CBC:  Lab Results  Component Value Date   WBC 7.8 02/14/2016   RBC 4.62 02/14/2016   BMP:  Lab Results  Component Value Date   GLUCOSE 120 (H) 02/14/2016   CO2 26 02/14/2016   BUN 12 02/14/2016   CREATININE 0.96 02/14/2016   CALCIUM 8.8 (L) 02/14/2016     Imaging studies: No new pertinent imaging studies available for review   Assessment/Plan: (ICD-10's: K67.9) 47 y.o. male with increasingly asymptomatic easily reducible  supra-umbilical hernia, complicated by pertinent comorbidities including obesity (BMI >31) and GERD.               - discussed with patient signs and symptoms of hernia incarceration and obstruction             - strategies for manual self-reduction of patient's umbilical hernia also reviewed and discussed  - maintain hydration with high fiber heart healthy diet to reduce/minimize constipation +/- daily stool softener as needed             - all risks, benefits, and alternatives to open repair of increasingly symptomatic supra-umbilical hernia with mesh were discussed with the patient, all of his questions were answered to patient's expressed satisfaction, patient expresses he wishes to proceed, and informed consent was obtained.             -  will plan for open repair of supra-umbilical hernia with mesh next week/on 10/4 per patient request             - anticipate return to clinic 2 weeks after above planned surgery             - instructed to call if any questions or concerns  All of the above recommendations were discussed with the patient, and all of patient's questions were answered to his expressed satisfaction.  Thank you for the opportunity to participate in this patient's care.  -- Scherrie Gerlach Earlene Plater, MD, RPVI [place]: [facility] General Surgery - Partnering for exceptional care. Office: [phone]

## 2017-12-05 ENCOUNTER — Other Ambulatory Visit: Payer: Self-pay

## 2017-12-05 ENCOUNTER — Encounter
Admission: RE | Admit: 2017-12-05 | Discharge: 2017-12-05 | Disposition: A | Discharge status: Home / Self Care | Payer: BLUE CROSS/BLUE SHIELD | Source: Ambulatory Visit | Attending: Surgery | Admitting: Surgery

## 2017-12-05 HISTORY — DX: Unspecified osteoarthritis, unspecified site: M19.90

## 2017-12-05 HISTORY — DX: Personal history of urinary calculi: Z87.442

## 2017-12-05 HISTORY — DX: Atelectasis: J98.11

## 2017-12-05 NOTE — Patient Instructions (Signed)
Your procedure is scheduled on: 12-09-17 FRIDAY Report to Same Day Surgery 2nd floor medical mall [facility] Entrance-take elevator on left to 2nd floor.  Check in with surgery information desk.) To find out your arrival time please call [phone] between 1PM - 3PM on 12-08-17 THURSDAY  Remember: Instructions that are not followed completely may result in serious medical risk, up to and including death, or upon the discretion of your surgeon and anesthesiologist your surgery may need to be rescheduled.    _x___ 1. Do not eat food after midnight the night before your procedure. NO GUM OR CANDY AFTER MIDNIGHT.  You may drink clear liquids up to 2 hours before you are scheduled to arrive at the hospital for your procedure.  Do not drink clear liquids within 2 hours of your scheduled arrival to the hospital.  Clear liquids include  --Water or Apple juice without pulp  --Clear carbohydrate beverage such as ClearFast or Gatorade  --Black Coffee or Clear Tea (No milk, no creamers, do not add anything to the coffee or Tea   ____Ensure clear carbohydrate drink on the way to the hospital for bariatric patients  ____Ensure clear carbohydrate drink 3 hours before surgery for Dr Rutherford Nail patients if physician instructed.    __x__ 2. No Alcohol for 24 hours before or after surgery.   __x__3. No Smoking or e-cigarettes for 24 prior to surgery.  Do not use any chewable tobacco products for at least 6 hour prior to surgery   ____  4. Bring all medications with you on the day of surgery if instructed.    __x__ 5. Notify your doctor if there is any change in your medical condition     (cold, fever, infections).    x___6. On the morning of surgery brush your teeth with toothpaste and water.  You may rinse your mouth with mouth wash if you wish.  Do not swallow any toothpaste or mouthwash.   Do not wear jewelry, make-up, hairpins, clips or nail polish.  Do not wear lotions, powders, or perfumes.  You may wear deodorant.  Do not shave 48 hours prior to surgery. Men may shave face and neck.  Do not bring valuables to the hospital.    [facility] is not responsible for any belongings or valuables.               Contacts, dentures or bridgework may not be worn into surgery.  Leave your suitcase in the car. After surgery it may be brought to your room.  For patients admitted to the hospital, discharge time is determined by your treatment team.  _  Patients discharged the day of surgery will not be allowed to drive home.  You will need someone to drive you home and stay with you the night of your procedure.    Please read over the following fact sheets that you were given:   [facility] Preparing for Surgery    ____ Take anti-hypertensive listed below, cardiac, seizure, asthma, anti-reflux and psychiatric medicines. These include:  1.NONE   2.  3.  4.  5.  6.  ____Fleets enema or Magnesium Citrate as directed.   _x___ Use CHG Soap or sage wipes as directed on instruction sheet   ____ Use inhalers on the day of surgery and bring to hospital day of surgery  ____ Stop Metformin and Janumet 2 days prior to surgery.    ____ Take 1/2 of usual insulin dose the night before surgery and none  on the morning surgery.   ____ Follow recommendations from Cardiologist, Pulmonologist or PCP regarding stopping Aspirin, Coumadin, Plavix ,Eliquis, Effient, or Pradaxa, and Pletal.  X____Stop Anti-inflammatories such as Advil, Aleve, Ibuprofen, Motrin, Naproxen, Naprosyn, Goodies powders or aspirin products NOW-OK to take Tylenol    ____ Stop supplements until after surgery.     ____ Bring C-Pap to the hospital.

## 2017-12-08 ENCOUNTER — Encounter
Admission: RE | Admit: 2017-12-08 | Discharge: 2017-12-08 | Disposition: A | Discharge status: Home / Self Care | Payer: BLUE CROSS/BLUE SHIELD | Source: Ambulatory Visit | Attending: Surgery | Admitting: Surgery

## 2017-12-08 DIAGNOSIS — Z01812 Encounter for preprocedural laboratory examination: Secondary | ICD-10-CM | POA: Insufficient documentation

## 2017-12-08 LAB — CBC WITH DIFFERENTIAL/PLATELET
Basophils Absolute: 0 10*3/uL (ref 0–0.1)
Basophils Relative: 1 %
EOS ABS: 0.1 10*3/uL (ref 0–0.7)
EOS PCT: 1 %
HCT: 42.9 % (ref 40.0–52.0)
Hemoglobin: 15.4 g/dL (ref 13.0–18.0)
Lymphocytes Relative: 23 %
Lymphs Abs: 1.5 10*3/uL (ref 1.0–3.6)
MCH: 35.5 pg — ABNORMAL HIGH (ref 26.0–34.0)
MCHC: 36 g/dL (ref 32.0–36.0)
MCV: 98.7 fL (ref 80.0–100.0)
MONO ABS: 0.5 10*3/uL (ref 0.2–1.0)
MONOS PCT: 7 %
Neutro Abs: 4.3 10*3/uL (ref 1.4–6.5)
Neutrophils Relative %: 68 %
PLATELETS: 193 10*3/uL (ref 150–440)
RBC: 4.34 MIL/uL — ABNORMAL LOW (ref 4.40–5.90)
RDW: 13.7 % (ref 11.5–14.5)
WBC: 6.4 10*3/uL (ref 3.8–10.6)

## 2017-12-08 LAB — BASIC METABOLIC PANEL
ANION GAP: 8 (ref 5–15)
BUN: 10 mg/dL (ref 6–20)
CALCIUM: 8.9 mg/dL (ref 8.9–10.3)
CO2: 28 mmol/L (ref 22–32)
Chloride: 105 mmol/L (ref 98–111)
Creatinine, Ser: 0.59 mg/dL — ABNORMAL LOW (ref 0.61–1.24)
GLUCOSE: 101 mg/dL — AB (ref 70–99)
POTASSIUM: 3.6 mmol/L (ref 3.5–5.1)
Sodium: 141 mmol/L (ref 135–145)

## 2017-12-08 MED ORDER — CEFAZOLIN SODIUM-DEXTROSE 2-4 GM/100ML-% IV SOLN
2.0000 g | INTRAVENOUS | Status: AC
  Administered 2017-12-09: 2 g via INTRAVENOUS

## 2017-12-09 ENCOUNTER — Ambulatory Visit: Payer: BLUE CROSS/BLUE SHIELD | Admitting: Anesthesiology

## 2017-12-09 ENCOUNTER — Encounter
Admission: RE | Disposition: A | Discharge status: Home / Self Care | Payer: Self-pay | Source: Ambulatory Visit | Attending: Surgery

## 2017-12-09 ENCOUNTER — Ambulatory Visit
Admission: RE | Admit: 2017-12-09 | Discharge: 2017-12-09 | Disposition: A | Discharge status: Home / Self Care | Payer: BLUE CROSS/BLUE SHIELD | Source: Ambulatory Visit | Attending: Surgery | Admitting: Surgery

## 2017-12-09 DIAGNOSIS — K429 Umbilical hernia without obstruction or gangrene: Secondary | ICD-10-CM

## 2017-12-09 DIAGNOSIS — M19071 Primary osteoarthritis, right ankle and foot: Secondary | ICD-10-CM | POA: Insufficient documentation

## 2017-12-09 DIAGNOSIS — Z6831 Body mass index (BMI) 31.0-31.9, adult: Secondary | ICD-10-CM | POA: Insufficient documentation

## 2017-12-09 DIAGNOSIS — M19072 Primary osteoarthritis, left ankle and foot: Secondary | ICD-10-CM | POA: Diagnosis not present

## 2017-12-09 DIAGNOSIS — E669 Obesity, unspecified: Secondary | ICD-10-CM | POA: Insufficient documentation

## 2017-12-09 DIAGNOSIS — K420 Umbilical hernia with obstruction, without gangrene: Secondary | ICD-10-CM | POA: Diagnosis present

## 2017-12-09 HISTORY — PX: UMBILICAL HERNIA REPAIR: SHX196

## 2017-12-09 SURGERY — REPAIR, HERNIA, UMBILICAL, ADULT
Anesthesia: General | Site: Abdomen

## 2017-12-09 MED ORDER — LACTATED RINGERS IV SOLN
INTRAVENOUS | Status: DC | PRN
  Administered 2017-12-09: 07:00:00 via INTRAVENOUS

## 2017-12-09 MED ORDER — MIDAZOLAM HCL 2 MG/2ML IJ SOLN
INTRAMUSCULAR | Status: DC | PRN
  Administered 2017-12-09: 2 mg via INTRAVENOUS

## 2017-12-09 MED ORDER — GABAPENTIN 300 MG PO CAPS
ORAL_CAPSULE | ORAL | Status: AC
  Administered 2017-12-09: 300 mg via ORAL
  Filled 2017-12-09: qty 1

## 2017-12-09 MED ORDER — FAMOTIDINE 20 MG PO TABS
ORAL_TABLET | ORAL | Status: AC
  Administered 2017-12-09: 20 mg via ORAL
  Filled 2017-12-09: qty 1

## 2017-12-09 MED ORDER — MIDAZOLAM HCL 2 MG/2ML IJ SOLN
INTRAMUSCULAR | Status: AC
  Filled 2017-12-09: qty 2

## 2017-12-09 MED ORDER — DEXAMETHASONE SODIUM PHOSPHATE 10 MG/ML IJ SOLN
INTRAMUSCULAR | Status: DC | PRN
  Administered 2017-12-09: 10 mg via INTRAVENOUS

## 2017-12-09 MED ORDER — ONDANSETRON HCL 4 MG/2ML IJ SOLN
INTRAMUSCULAR | Status: DC | PRN
  Administered 2017-12-09: 4 mg via INTRAVENOUS

## 2017-12-09 MED ORDER — OXYCODONE-ACETAMINOPHEN 5-325 MG PO TABS
1.0000 | ORAL_TABLET | ORAL | Status: DC | PRN
  Administered 2017-12-09: 1 via ORAL

## 2017-12-09 MED ORDER — EPHEDRINE SULFATE 50 MG/ML IJ SOLN
INTRAMUSCULAR | Status: DC | PRN
  Administered 2017-12-09: 10 mg via INTRAVENOUS

## 2017-12-09 MED ORDER — FENTANYL CITRATE (PF) 100 MCG/2ML IJ SOLN
INTRAMUSCULAR | Status: AC
  Filled 2017-12-09: qty 2

## 2017-12-09 MED ORDER — ACETAMINOPHEN 500 MG PO TABS
1000.0000 mg | ORAL_TABLET | ORAL | Status: AC
  Administered 2017-12-09: 1000 mg via ORAL

## 2017-12-09 MED ORDER — HYDROMORPHONE HCL 1 MG/ML IJ SOLN
INTRAMUSCULAR | Status: DC | PRN
  Administered 2017-12-09: 0.5 mg via INTRAVENOUS

## 2017-12-09 MED ORDER — OXYCODONE-ACETAMINOPHEN 5-325 MG PO TABS: 1.0000 | ORAL_TABLET | ORAL | 0 refills | Status: DC | PRN

## 2017-12-09 MED ORDER — ACETAMINOPHEN 500 MG PO TABS
ORAL_TABLET | ORAL | Status: AC
  Administered 2017-12-09: 1000 mg via ORAL
  Filled 2017-12-09: qty 2

## 2017-12-09 MED ORDER — LIDOCAINE HCL (PF) 2 % IJ SOLN
INTRAMUSCULAR | Status: AC
  Filled 2017-12-09: qty 10

## 2017-12-09 MED ORDER — FAMOTIDINE 20 MG PO TABS
20.0000 mg | ORAL_TABLET | Freq: Once | ORAL | Status: AC
  Administered 2017-12-09: 20 mg via ORAL

## 2017-12-09 MED ORDER — CEFAZOLIN SODIUM-DEXTROSE 2-4 GM/100ML-% IV SOLN
INTRAVENOUS | Status: AC
  Filled 2017-12-09: qty 100

## 2017-12-09 MED ORDER — PROPOFOL 10 MG/ML IV BOLUS
INTRAVENOUS | Status: AC
  Filled 2017-12-09: qty 20

## 2017-12-09 MED ORDER — ONDANSETRON HCL 4 MG/2ML IJ SOLN
INTRAMUSCULAR | Status: AC
  Filled 2017-12-09: qty 2

## 2017-12-09 MED ORDER — DEXMEDETOMIDINE HCL IN NACL 200 MCG/50ML IV SOLN
INTRAVENOUS | Status: AC
  Filled 2017-12-09: qty 50

## 2017-12-09 MED ORDER — HYDROMORPHONE HCL 1 MG/ML IJ SOLN
INTRAMUSCULAR | Status: AC
  Filled 2017-12-09: qty 1

## 2017-12-09 MED ORDER — BUPIVACAINE HCL (PF) 0.5 % IJ SOLN
INTRAMUSCULAR | Status: AC
  Filled 2017-12-09: qty 30

## 2017-12-09 MED ORDER — LIDOCAINE HCL (CARDIAC) PF 100 MG/5ML IV SOSY
PREFILLED_SYRINGE | INTRAVENOUS | Status: DC | PRN
  Administered 2017-12-09: 100 mg via INTRAVENOUS

## 2017-12-09 MED ORDER — LIDOCAINE HCL 1 % IJ SOLN
INTRAMUSCULAR | Status: DC | PRN
  Administered 2017-12-09: 20 mL

## 2017-12-09 MED ORDER — FENTANYL CITRATE (PF) 100 MCG/2ML IJ SOLN
INTRAMUSCULAR | Status: DC | PRN
  Administered 2017-12-09 (×2): 50 ug via INTRAVENOUS

## 2017-12-09 MED ORDER — GABAPENTIN 300 MG PO CAPS
300.0000 mg | ORAL_CAPSULE | ORAL | Status: AC
  Administered 2017-12-09: 300 mg via ORAL

## 2017-12-09 MED ORDER — LACTATED RINGERS IV SOLN
INTRAVENOUS | Status: DC
  Administered 2017-12-09: 07:00:00 via INTRAVENOUS

## 2017-12-09 MED ORDER — PROPOFOL 10 MG/ML IV BOLUS
INTRAVENOUS | Status: DC | PRN
  Administered 2017-12-09: 200 mg via INTRAVENOUS

## 2017-12-09 MED ORDER — CHLORHEXIDINE GLUCONATE CLOTH 2 % EX PADS: 6.0000 | MEDICATED_PAD | Freq: Once | CUTANEOUS | Status: DC

## 2017-12-09 MED ORDER — PHENYLEPHRINE HCL 10 MG/ML IJ SOLN
INTRAMUSCULAR | Status: DC | PRN
  Administered 2017-12-09: 100 ug via INTRAVENOUS
  Administered 2017-12-09 (×3): 200 ug via INTRAVENOUS

## 2017-12-09 MED ORDER — OXYCODONE-ACETAMINOPHEN 5-325 MG PO TABS
ORAL_TABLET | ORAL | Status: AC
  Filled 2017-12-09: qty 1

## 2017-12-09 MED ORDER — LIDOCAINE HCL (PF) 1 % IJ SOLN
INTRAMUSCULAR | Status: AC
  Filled 2017-12-09: qty 30

## 2017-12-09 SURGICAL SUPPLY — 27 items
ADH SKN CLS APL DERMABOND .7 (GAUZE/BANDAGES/DRESSINGS) ×1
BLADE CLIPPER SURG (BLADE) ×2 IMPLANT
CHLORAPREP W/TINT 26ML (MISCELLANEOUS) ×3 IMPLANT
DECANTER SPIKE VIAL GLASS SM (MISCELLANEOUS) ×6 IMPLANT
DERMABOND ADVANCED (GAUZE/BANDAGES/DRESSINGS) ×2
DERMABOND ADVANCED .7 DNX12 (GAUZE/BANDAGES/DRESSINGS) ×1 IMPLANT
DRAPE LAPAROTOMY 77X122 PED (DRAPES) ×3 IMPLANT
ELECT REM PT RETURN 9FT ADLT (ELECTROSURGICAL) ×3
ELECTRODE REM PT RTRN 9FT ADLT (ELECTROSURGICAL) ×1 IMPLANT
GLOVE BIO SURGEON STRL SZ7 (GLOVE) ×3 IMPLANT
GLOVE BIOGEL PI IND STRL 7.5 (GLOVE) ×1 IMPLANT
GLOVE BIOGEL PI INDICATOR 7.5 (GLOVE) ×2
GOWN STRL REUS W/ TWL LRG LVL3 (GOWN DISPOSABLE) ×1 IMPLANT
GOWN STRL REUS W/TWL LRG LVL3 (GOWN DISPOSABLE) ×3
INSERT FOAM CUSHION (MISCELLANEOUS) IMPLANT
KIT TURNOVER KIT A (KITS) ×3 IMPLANT
MESH VENTRALEX ST 1-7/10 CRC S (Mesh General) ×2 IMPLANT
NEEDLE HYPO 22GX1.5 SAFETY (NEEDLE) ×3 IMPLANT
NS IRRIG 1000ML POUR BTL (IV SOLUTION) ×3 IMPLANT
PACK BASIN MINOR ARMC (MISCELLANEOUS) ×3 IMPLANT
SUT ETHIBOND 0 MO6 C/R (SUTURE) ×3 IMPLANT
SUT MNCRL AB 4-0 PS2 18 (SUTURE) ×3 IMPLANT
SUT VIC AB 2-0 SH 27 (SUTURE) ×3
SUT VIC AB 2-0 SH 27XBRD (SUTURE) ×1 IMPLANT
SUT VIC AB 3-0 SH 27 (SUTURE) ×3
SUT VIC AB 3-0 SH 27X BRD (SUTURE) ×1 IMPLANT
SYR 10ML LL (SYRINGE) ×3 IMPLANT

## 2017-12-09 NOTE — Transfer of Care (Signed)
2Immediate Anesthesia Transfer of Care Note  Patient: Tracy Villarreal  Procedure(s) Performed: HERNIA REPAIR UMBILICAL ADULT (N/A Abdomen)  Patient Location: PACU  Anesthesia Type:General  Level of Consciousness: sedated  Airway & Oxygen Therapy: Patient Spontanous Breathing and Patient connected to face mask oxygen  Post-op Assessment: Report given to RN and Post -op Vital signs reviewed and stable  Post vital signs: Reviewed and stable  Last Vitals:  Vitals Value Taken Time  BP 118/78 12/09/2017  9:21 AM  Temp    Pulse 78 12/09/2017  9:26 AM  Resp 21 12/09/2017  9:26 AM  SpO2 97 % 12/09/2017  9:26 AM  Vitals shown include unvalidated device data.  Last Pain:  Vitals:   12/09/17 0612  TempSrc: Temporal         Complications: No apparent anesthesia complications

## 2017-12-09 NOTE — Op Note (Signed)
SURGICAL OPERATIVE REPORT  DATE OF PROCEDURE: 12/09/2017  ATTENDING Surgeon(s): Ancil Linsey, MD   ASSISTANT(S): Matt Scheeler, PA-S  ANESTHESIA: GETA  PRE-OPERATIVE DIAGNOSIS: Increasingly symptomatic (painful) incarcerated fat-containing umbilical hernia (icd-10: K42.9)  POST-OPERATIVE DIAGNOSIS: Increasingly symptomatic (painful) incarcerated fat-containing umbilical hernia (icd-10: K42.9)  PROCEDURE(S):  1.) Open repair of increasingly symptomatic (painful) incarcerated umbilical hernia with mesh (cpt: 16109)  INTRAOPERATIVE FINDINGS: 1.5 cm umbilical hernia with adherent omentum and hernia sac  INTRAVENOUS FLUIDS: 800 mL crystalloid   ESTIMATED BLOOD LOSS: Minimal (<20 mL)   URINE OUTPUT: No Foley catheter  SPECIMENS: None  IMPLANTS: 4.3 cm Bard Ventralex ST ventral hernia repair mesh  DRAINS: None  COMPLICATIONS: None apparent  CONDITION AT END OF PROCEDURE: Hemodynamically stable and extubated  DISPOSITION OF PATIENT: PACU  INDICATIONS FOR PROCEDURE:  Patient is a 47 y.o. otherwise healthy male who presented upon referral from his PMD for evaluation of patient's umbilical hernia. He denied abdominal distention, change in bowel function, or N/V and likewise denied constipation, straining with urination, or frequent coughing and expressed that he'd like to have it repaired. All risks, benefits, and alternatives to above elective procedure were discussed with the patient, all of patient's questions were answered to his expressed satisfaction, and informed consent was obtained and documented accordingly.  DETAILS OF PROCEDURE: Patient was brought to the operating suite and appropriately identified. General anesthesia was administered along with appropriate pre-operative antibiotics, and endotracheal intubation was performed by anesthetist. In supine position, operative site was prepped and draped in the usual sterile fashion, and following a brief time out, local  anesthetic was injected superior to the umbilicus, and a 3 cm transverse curvilinear incision was made using a #15 blade scalpel and extended deep through subcutaneous tissues using blunt dissection and electrocautery. The hernia sac was then dissected from the undersurface of the umbilical skin and stalk, and the fascial defect was cleared of all omental adhesions. A 4.3 cm Bard Ventralex ST mesh patch was selected, inserted, confirmed to approximate well to fascial edges without intervening viscera or omentum, and patch was secured without tension using Ethibond braided non-absorbable suture. The umbilicus was then invaginated using 2-0 Vicryl suture from the dermis to fascia, and the wound was irrigated using sterile saline. Overlying tissues were re-approximated in layers using buried interrupted 3-0 Vicryl suture for dermis and to re-approximate skin, which was cleaned and dried and sterile Dermabond skin glue was applied and allowed to dry.  Patient was then safely able to be extubated, awakened, and transferred to PACU for post-operative monitoring and care.  I was present for all aspects of the above procedure, and no operative complications were apparent.

## 2017-12-09 NOTE — Interval H&P Note (Signed)
History and Physical Interval Note:  12/09/2017 7:19 AM  Tracy Villarreal  has presented today for surgery, with the diagnosis of umbilical hernia  The various methods of treatment have been discussed with the patient and family. After consideration of risks, benefits and other options for treatment, the patient has consented to  Procedure(s): HERNIA REPAIR UMBILICAL ADULT (N/A) as a surgical intervention .  The patient's history has been reviewed, patient examined, no change in status, stable for surgery.  I have reviewed the patient's chart and labs.  Questions were answered to the patient's satisfaction.     Ancil Linsey

## 2017-12-09 NOTE — Anesthesia Post-op Follow-up Note (Signed)
Anesthesia QCDR form completed.        

## 2017-12-09 NOTE — Anesthesia Postprocedure Evaluation (Signed)
Anesthesia Post Note  Patient: Tracy Villarreal  Procedure(s) Performed: HERNIA REPAIR UMBILICAL ADULT (N/A Abdomen)  Patient location during evaluation: PACU Anesthesia Type: General Level of consciousness: awake and alert and oriented Pain management: pain level controlled Vital Signs Assessment: post-procedure vital signs reviewed and stable Respiratory status: spontaneous breathing, nonlabored ventilation and respiratory function stable Cardiovascular status: blood pressure returned to baseline and stable Postop Assessment: no signs of nausea or vomiting Anesthetic complications: no     Last Vitals:  Vitals:   12/09/17 1019 12/09/17 1044  BP: 121/76 129/86  Pulse: 78 73  Resp: 16 16  Temp: (!) 36.3 C   SpO2: 93% 95%    Last Pain:  Vitals:   12/09/17 1044  TempSrc:   PainSc: 2                  Gordy Goar

## 2017-12-09 NOTE — Discharge Instructions (Addendum)
In addition to included general post-operative instructions for Open Repair of Umbilical Hernia with mesh,  Diet: Resume home heart healthy diet.   Activity: No heavy lifting >15 - 20 pounds (children, pets, laundry, garbage) or strenuous activity until follow-up, but light activity and walking are encouraged. Do not drive or drink alcohol if taking narcotic pain medications.  Wound care: 2 days after surgery (Sunday, 10/6), you may shower/get incision wet with soapy water and pat dry (do not rub incisions), but no baths or submerging incision underwater until follow-up.   Medications: Resume all home medications. For mild to moderate pain: acetaminophen (Tylenol) or ibuprofen/naproxen (if no kidney disease). Combining Tylenol with alcohol can substantially increase your risk of causing liver disease. Narcotic pain medications, if prescribed, can be used for severe pain, though may cause nausea, constipation, and drowsiness. Do not combine Tylenol and Percocet (or similar) within a 6 hour period as Percocet (and similar) contain(s) Tylenol. If you do not need the narcotic pain medication, you do not need to fill the prescription.  Call office [phone]) at any time if any questions, worsening pain, fevers/chills, bleeding, drainage from incision site, or other concerns.   AMBULATORY SURGERY  DISCHARGE INSTRUCTIONS   1) The drugs that you were given will stay in your system until tomorrow so for the next 24 hours you should not:  A) Drive an automobile B) Make any legal decisions C) Drink any alcoholic beverage   2) You may resume regular meals tomorrow.  Today it is better to start with liquids and gradually work up to solid foods.  You may eat anything you prefer, but it is better to start with liquids, then soup and crackers, and gradually work up to solid foods.   3) Please notify your doctor immediately if you have any unusual bleeding, trouble breathing, redness and pain at the  surgery site, drainage, fever, or pain not relieved by medication.    4) Additional Instructions:        Please contact your physician with any problems or Same Day Surgery at [phone], Monday through Friday 6 am to 4 pm, or [place] at [facility] number at [phone].

## 2017-12-09 NOTE — Anesthesia Preprocedure Evaluation (Signed)
Anesthesia Evaluation  Patient identified by MRN, date of birth, ID band Patient awake    Reviewed: Allergy & Precautions, NPO status , Patient's Chart, lab work & pertinent test results  History of Anesthesia Complications Negative for: history of anesthetic complications  Airway Mallampati: III  TM Distance: >3 FB Neck ROM: Full    Dental no notable dental hx.    Pulmonary neg pulmonary ROS, neg sleep apnea, neg COPD,    breath sounds clear to auscultation- rhonchi (-) wheezing      Cardiovascular Exercise Tolerance: Good (-) hypertension(-) CAD and (-) Past MI  Rhythm:Regular Rate:Normal - Systolic murmurs and - Diastolic murmurs    Neuro/Psych negative neurological ROS  negative psych ROS   GI/Hepatic negative GI ROS, Neg liver ROS,   Endo/Other  negative endocrine ROSneg diabetes  Renal/GU negative Renal ROS     Musculoskeletal  (+) Arthritis ,   Abdominal (+) + obese,   Peds  Hematology negative hematology ROS (+)   Anesthesia Other Findings Past Medical History: No date: Arthritis     Comment:  BIL FEET No date: Collapse of right lung     Comment:  YEARS AGO No date: GERD (gastroesophageal reflux disease)     Comment:  RARE-NO MEDS No date: History of kidney stones     Comment:  H/O   Reproductive/Obstetrics                             Anesthesia Physical Anesthesia Plan  ASA: II  Anesthesia Plan: General   Post-op Pain Management:    Induction: Intravenous  PONV Risk Score and Plan: 1 and Ondansetron and Midazolam  Airway Management Planned: LMA  Additional Equipment:   Intra-op Plan:   Post-operative Plan:   Informed Consent: I have reviewed the patients History and Physical, chart, labs and discussed the procedure including the risks, benefits and alternatives for the proposed anesthesia with the patient or authorized representative who has indicated his/her  understanding and acceptance.   Dental advisory given  Plan Discussed with: CRNA and Anesthesiologist  Anesthesia Plan Comments:         Anesthesia Quick Evaluation

## 2017-12-09 NOTE — Anesthesia Procedure Notes (Signed)
Procedure Name: LMA Insertion Date/Time: 12/09/2017 7:31 AM Performed by: Almeta Monas, CRNA Pre-anesthesia Checklist: Patient identified, Patient being monitored, Timeout performed, Emergency Drugs available and Suction available Patient Re-evaluated:Patient Re-evaluated prior to induction Oxygen Delivery Method: Circle system utilized Preoxygenation: Pre-oxygenation with 100% oxygen Induction Type: IV induction Ventilation: Mask ventilation without difficulty LMA: LMA inserted LMA Size: 4.5 Tube type: Oral Number of attempts: 1 Placement Confirmation: positive ETCO2 and breath sounds checked- equal and bilateral Tube secured with: Tape Dental Injury: Teeth and Oropharynx as per pre-operative assessment

## 2017-12-11 DIAGNOSIS — K429 Umbilical hernia without obstruction or gangrene: Secondary | ICD-10-CM

## 2017-12-22 ENCOUNTER — Encounter: Payer: Self-pay | Admitting: Surgery

## 2017-12-22 ENCOUNTER — Ambulatory Visit (INDEPENDENT_AMBULATORY_CARE_PROVIDER_SITE_OTHER): Payer: BLUE CROSS/BLUE SHIELD | Admitting: Surgery

## 2017-12-22 ENCOUNTER — Other Ambulatory Visit: Payer: Self-pay

## 2017-12-22 VITALS — BP 136/90 | HR 87 | Temp 97.9°F | Resp 14 | Ht 70.5 in | Wt 221.0 lb

## 2017-12-22 DIAGNOSIS — Z4889 Encounter for other specified surgical aftercare: Secondary | ICD-10-CM

## 2017-12-22 NOTE — Progress Notes (Signed)
Surgical Clinic Progress/Follow-up Note   HPI:  47 y.o. Male presents to clinic for post-op follow-up 13 Days s/p open repair of increasingly symptomatic umbilical hernia with mesh Earlene Plater, 12/09/2017). Patient reports complete resolution of pre-operative pain and has been tolerating regular diet with +flatus and normal BM's, denies N/V, fever/chills, CP, or SOB.  Review of Systems:  Constitutional: denies fever/chills  Respiratory: denies shortness of breath, wheezing  Cardiovascular: denies chest pain, palpitations  Gastrointestinal: abdominal pain, N/V, and bowel function as per interval history Skin: Denies any other rashes or skin discolorations except post-surgical wounds as per interval history  Vital Signs:  BP 136/90   Pulse 87   Temp 97.9 F (36.6 C) (Skin)   Resp 14   Ht 5' 10.5" (1.791 m)   Wt 221 lb (100.2 kg)   SpO2 98%   BMI 31.26 kg/m    Physical Exam:  Constitutional:  -- Normal body habitus  -- Awake, alert, and oriented x3  Pulmonary:  -- No crackles -- Equal breath sounds bilaterally -- Breathing non-labored at rest Cardiovascular:  -- S1, S2 present  -- No pericardial rubs  Gastrointestinal:  -- Soft and non-distended, with minimal peri-incisional tenderness to palpation, no guarding/rebound tenderness -- Post-surgical incisions all well-approximated without any peri-incisional erythema or drainage -- No abdominal masses appreciated, pulsatile or otherwise  Musculoskeletal / Integumentary:  -- Wounds or skin discoloration: None appreciated except post-surgical incisions as described above (GI) -- Extremities: B/L UE and LE FROM, hands and feet warm, no edema   Imaging: No new pertinent imaging available for review  Assessment:  47 y.o. yo Male with a problem list including...  Patient Active Problem List   Diagnosis Date Noted  . Umbilical hernia without obstruction and without gangrene   . Gout 02/17/2016  . Hearing loss 02/17/2016     presents to clinic for post-op follow-up evaluation, doing well 13 Days s/p open repair of increasingly symptomatic umbilical hernia with mesh Earlene Plater, 12/09/2017).  Plan:              - advance diet as tolerated              - okay to submerge incisions under water (baths, swimming) prn             - no heavy lifting >40 lbs or equivalent strenuous activities x 4 more weeks, after which may gradually resume all activities without restrictions             - apply sunblock particularly to incisions with sun exposure to reduce pigmentation of scars             - return to clinic as needed, instructed to call office if any questions or concerns  All of the above recommendations were discussed with the patient, and all of patient's questions were answered to his expressed satisfaction.  -- Scherrie Gerlach Earlene Plater, MD, RPVI [place]: [facility] General Surgery - Partnering for exceptional care. Office: [phone]

## 2017-12-22 NOTE — Patient Instructions (Addendum)
Follow up as needed. No lifting more than 40 pounds for 1 month May return to work on 12/26/17     The patient is aware to call back for any questions or new concerns.  GENERAL POST-OPERATIVE PATIENT INSTRUCTIONS   WOUND CARE INSTRUCTIONS:  Keep a dry clean dressing on the wound if there is drainage. The initial bandage may be removed after 24 hours.  Once the wound has quit draining you may leave it open to air.  If clothing rubs against the wound or causes irritation and the wound is not draining you may cover it with a dry dressing during the daytime.  Try to keep the wound dry and avoid ointments on the wound unless directed to do so.  If the wound becomes bright red and painful or starts to drain infected material that is not clear, please contact your physician immediately.  If the wound is mildly pink and has a thick firm ridge underneath it, this is normal, and is referred to as a healing ridge.  This will resolve over the next 4-6 weeks.  BATHING: You may shower if you have been informed of this by your surgeon. However, Please do not submerge in a tub, hot tub, or pool until incisions are completely sealed or have been told by your surgeon that you may do so.  DIET:  You may eat any foods that you can tolerate.  It is a good idea to eat a high fiber diet and take in plenty of fluids to prevent constipation.  If you do become constipated you may want to take a mild laxative or take ducolax tablets on a daily basis until your bowel habits are regular.  Constipation can be very uncomfortable, along with straining, after recent surgery.  ACTIVITY:  You are encouraged to cough and deep breath or use your incentive spirometer if you were given one, every 15-30 minutes when awake.  This will help prevent respiratory complications and low grade fevers post-operatively if you had a general anesthetic.  You may want to hug a pillow when coughing and sneezing to add additional support to the  surgical area, if you had abdominal or chest surgery, which will decrease pain during these times.  You are encouraged to walk and engage in light activity for the next two weeks.  You should not lift more than 40 pounds for 4 weeks, as it could put you at increased risk for complications.  Twenty pounds is roughly equivalent to a plastic bag of groceries. At that time- Listen to your body when lifting, if you have pain when lifting, stop and then try again in a few days. Soreness after doing exercises or activities of daily living is normal as you get back in to your normal routine.  MEDICATIONS:  Try to take narcotic medications and anti-inflammatory medications, such as tylenol, ibuprofen, naprosyn, etc., with food.  This will minimize stomach upset from the medication.  Should you develop nausea and vomiting from the pain medication, or develop a rash, please discontinue the medication and contact your physician.  You should not drive, make important decisions, or operate machinery when taking narcotic pain medication.  SUNBLOCK Use sun block to incision area over the next year if this area will be exposed to sun. This helps decrease scarring and will allow you avoid a permanent darkened area over your incision.  QUESTIONS:  Please feel free to call our office if you have any questions, and we will be glad to  assist you. [phone]

## 2018-03-03 ENCOUNTER — Encounter: Payer: Self-pay | Admitting: Gynecology

## 2018-03-03 ENCOUNTER — Ambulatory Visit
Admission: EM | Admit: 2018-03-03 | Discharge: 2018-03-03 | Disposition: A | Discharge status: Home / Self Care | Payer: BLUE CROSS/BLUE SHIELD | Attending: Family Medicine | Admitting: Family Medicine

## 2018-03-03 ENCOUNTER — Other Ambulatory Visit: Payer: Self-pay

## 2018-03-03 DIAGNOSIS — R05 Cough: Secondary | ICD-10-CM | POA: Insufficient documentation

## 2018-03-03 DIAGNOSIS — R059 Cough, unspecified: Secondary | ICD-10-CM

## 2018-03-03 MED ORDER — BENZONATATE 100 MG PO CAPS: 100.0000 mg | ORAL_CAPSULE | Freq: Three times a day (TID) | ORAL | 0 refills | Status: DC | PRN

## 2018-03-03 MED ORDER — HYDROCOD POLST-CPM POLST ER 10-8 MG/5ML PO SUER: 5.0000 mL | Freq: Two times a day (BID) | ORAL | 0 refills | Status: DC | PRN

## 2018-03-03 NOTE — ED Provider Notes (Signed)
[facility]    CSN: [id number] Arrival date & time: 03/03/18  1608  History   Chief Complaint Cough  HPI  47 year old male presents with cough.  Dry cough for the last 4 to 5 days.  He states that it is nonproductive.  No fever.  No chills.  He states that he is exhausted from the coughing.  Unable to sleep at night.  He has tried numerous over-the-counter medications without relief.  No other exacerbating factors.  No reports of shortness of breath.  No fever. No other associated symptoms.  No other complaints.  Past Medical History:  Diagnosis Date  . Arthritis    BIL FEET  . Collapse of right lung    YEARS AGO  . GERD (gastroesophageal reflux disease)    RARE-NO MEDS  . History of kidney stones    H/O   Patient Active Problem List   Diagnosis Date Noted  . Umbilical hernia without obstruction and without gangrene   . Gout 02/17/2016  . Hearing loss 02/17/2016   Past Surgical History:  Procedure Laterality Date  . NO PAST SURGERIES    . UMBILICAL HERNIA REPAIR N/A 12/09/2017   Procedure: HERNIA REPAIR UMBILICAL ADULT;  Surgeon: Ancil Linseyavis, Jason Evan, MD;  Location: [facility] ORS;  Service: Vascular;  Laterality: N/A;   Home Medications    Prior to Admission medications   Medication Sig Start Date End Date Taking? Authorizing Provider  fluticasone (FLONASE) 50 MCG/ACT nasal spray Place 1 spray into both nostrils daily.  11/28/17  Yes [provider]  Melatonin 5 MG TABS Take by mouth at bedtime.   Yes [provider]  benzonatate (TESSALON) 100 MG capsule Take 1 capsule (100 mg total) by mouth 3 (three) times daily as needed. 03/03/18   Tommie Samsook, Saintclair Schroader G, DO  chlorpheniramine-HYDROcodone (TUSSIONEX PENNKINETIC ER) 10-8 MG/5ML SUER Take 5 mLs by mouth every 12 (twelve) hours as needed. 03/03/18   Tommie Samsook, Janyia Guion G, DO   Family History Family History  Problem Relation Age of Onset  . Skin cancer Mother   . Cancer Sister   . Cancer Paternal Grandfather     . Prostate cancer Neg Hx   . Bladder Cancer Neg Hx   . Kidney cancer Neg Hx    Social History Social History   Tobacco Use  . Smoking status: Never Smoker  . Smokeless tobacco: Never Used  Substance Use Topics  . Alcohol use: Yes    Alcohol/week: 8.0 standard drinks    Types: 8 Shots of liquor per week    Comment: OCC  . Drug use: No   Allergies   Patient has no known allergies.  Review of Systems Review of Systems  Constitutional: Negative for fever.  Respiratory: Positive for cough.    Physical Exam Triage Vital Signs ED Triage Vitals  Enc Vitals Group     BP 03/03/18 1625 (!) 151/104     Pulse Rate 03/03/18 1625 77     Resp 03/03/18 1625 16     Temp 03/03/18 1625 98.5 F (36.9 C)     Temp Source 03/03/18 1625 Oral     SpO2 03/03/18 1625 100 %     Weight 03/03/18 1627 205 lb (93 kg)     Height 03/03/18 1627 5\' 10"  (1.778 m)     Head Circumference --      Peak Flow --      Pain Score 03/03/18 1627 5     Pain Loc --  Pain Edu? --      Excl. in GC? --    Updated Vital Signs BP (!) 151/104 (BP Location: Left Arm)   Pulse 77   Temp 98.5 F (36.9 C) (Oral)   Resp 16   Ht 5\' 10"  (1.778 m)   Wt 93 kg   SpO2 100%   BMI 29.41 kg/m   Visual Acuity Right Eye Distance:   Left Eye Distance:   Bilateral Distance:    Right Eye Near:   Left Eye Near:    Bilateral Near:     Physical Exam Vitals signs and nursing note reviewed.  Constitutional:      General: He is not in acute distress. HENT:     Head: Normocephalic and atraumatic.     Right Ear: Tympanic membrane normal.     Left Ear: Tympanic membrane normal.     Mouth/Throat:     Pharynx: Oropharynx is clear. No posterior oropharyngeal erythema.  Cardiovascular:     Rate and Rhythm: Normal rate and regular rhythm.  Pulmonary:     Effort: Pulmonary effort is normal. No respiratory distress.     Breath sounds: No wheezing, rhonchi or rales.  Neurological:     Mental Status: He is alert.   Psychiatric:        Mood and Affect: Mood normal.        Behavior: Behavior normal.    UC Treatments / Results  Labs (all labs ordered are listed, but only abnormal results are displayed) Labs Reviewed - No data to display  EKG None  Radiology No results found.  Procedures Procedures (including critical care time)  Medications Ordered in UC Medications - No data to display  Initial Impression / Assessment and Plan / UC Course  I have reviewed the triage vital signs and the nursing notes.  Pertinent labs & imaging results that were available during my care of the patient were reviewed by me and considered in my medical decision making (see chart for details).    47 year old male presents with cough.  No indications for antibiotics.  Treating with Tussionex and Tessalon Perles.  Final Clinical Impressions(s) / UC Diagnoses   Final diagnoses:  Cough   Discharge Instructions   None    ED Prescriptions    Medication Sig Dispense Auth. Provider   chlorpheniramine-HYDROcodone (TUSSIONEX PENNKINETIC ER) 10-8 MG/5ML SUER Take 5 mLs by mouth every 12 (twelve) hours as needed. 115 mL Koltyn Kelsay G, DO   benzonatate (TESSALON) 100 MG capsule Take 1 capsule (100 mg total) by mouth 3 (three) times daily as needed. 30 capsule Tommie Samsook, Zorah Backes G, DO     Controlled Substance Prescriptions [place] Controlled Substance Registry consulted? Not Applicable   Tommie SamsCook, Irbin Fines G, DO 03/03/18 1919

## 2018-03-03 NOTE — ED Triage Notes (Signed)
Patient c/o severe cough x 4 days.

## 2019-02-26 ENCOUNTER — Ambulatory Visit
Admission: EM | Admit: 2019-02-26 | Discharge: 2019-02-26 | Disposition: A | Discharge status: Home / Self Care | Payer: 59 | Attending: Family Medicine | Admitting: Family Medicine

## 2019-02-26 ENCOUNTER — Other Ambulatory Visit: Payer: Self-pay

## 2019-02-26 DIAGNOSIS — Z20822 Contact with and (suspected) exposure to covid-19: Secondary | ICD-10-CM

## 2019-02-26 DIAGNOSIS — R059 Cough, unspecified: Secondary | ICD-10-CM

## 2019-02-26 DIAGNOSIS — R05 Cough: Secondary | ICD-10-CM | POA: Diagnosis not present

## 2019-02-26 DIAGNOSIS — Z20828 Contact with and (suspected) exposure to other viral communicable diseases: Secondary | ICD-10-CM | POA: Diagnosis not present

## 2019-02-26 HISTORY — DX: Allergy, unspecified, initial encounter: T78.40XA

## 2019-02-26 MED ORDER — HYDROCODONE-HOMATROPINE 5-1.5 MG/5ML PO SYRP: 5.0000 mL | ORAL_SOLUTION | Freq: Four times a day (QID) | ORAL | 0 refills | Status: DC | PRN

## 2019-02-26 NOTE — ED Provider Notes (Signed)
[facility]    CSN: [id number] Arrival date & time: 02/26/19  1204  History   Chief Complaint Chief Complaint  Patient presents with  . Cough   HPI  48 year old male presents with cough and chills.  Patient reports that he has had a cough for the past week.  He states that it is worse when he goes out into the cold air and also worse when he takes a shower.  He states that today he had severe proximal-isms of cough which led to emesis.  Patient states that he went to work and felt poorly at work due to cold sweats.  Continues to cough.  No documented fever.  No reported sick contacts.  No known relieving factors.  He states that his cough is also worse at night.  Interfering with sleep.  No other associated symptoms.  No other complaints.  Past Medical History:  Diagnosis Date  . Allergies   . Arthritis    BIL FEET  . Collapse of right lung    YEARS AGO  . GERD (gastroesophageal reflux disease)    RARE-NO MEDS  . History of kidney stones    H/O    Patient Active Problem List   Diagnosis Date Noted  . Umbilical hernia without obstruction and without gangrene   . Gout 02/17/2016  . Hearing loss 02/17/2016    Past Surgical History:  Procedure Laterality Date  . NO PAST SURGERIES    . UMBILICAL HERNIA REPAIR N/A 12/09/2017   Procedure: HERNIA REPAIR UMBILICAL ADULT;  Surgeon: Vickie Epley, MD;  Location: [facility] ORS;  Service: Vascular;  Laterality: N/A;       Home Medications    Prior to Admission medications   Medication Sig Start Date End Date Taking? Authorizing Provider  fluticasone (FLONASE) 50 MCG/ACT nasal spray Place 1 spray into both nostrils daily.  11/28/17   [provider]  HYDROcodone-homatropine (HYCODAN) 5-1.5 MG/5ML syrup Take 5 mLs by mouth every 6 (six) hours as needed. 02/26/19   Coral Spikes, DO  Melatonin 5 MG TABS Take by mouth at bedtime.    [provider]    Family History Family History  Problem  Relation Age of Onset  . Skin cancer Mother   . Cancer Sister   . Cancer Paternal Grandfather   . Prostate cancer Neg Hx   . Bladder Cancer Neg Hx   . Kidney cancer Neg Hx     Social History Social History   Tobacco Use  . Smoking status: Never Smoker  . Smokeless tobacco: Never Used  Substance Use Topics  . Alcohol use: Yes    Alcohol/week: 8.0 standard drinks    Types: 8 Shots of liquor per week    Comment: OCC  . Drug use: No     Allergies   Patient has no known allergies.   Review of Systems Review of Systems  Constitutional: Positive for chills. Negative for fever.  Respiratory: Positive for cough.    Physical Exam Triage Vital Signs ED Triage Vitals  Enc Vitals Group     BP 02/26/19 1219 (!) 155/110     Pulse Rate 02/26/19 1219 86     Resp 02/26/19 1219 19     Temp 02/26/19 1219 97.8 F (36.6 C)     Temp Source 02/26/19 1219 Oral     SpO2 02/26/19 1219 99 %     Weight 02/26/19 1218 220 lb (99.8 kg)     Height 02/26/19 1218 5'  10" (1.778 m)     Head Circumference --      Peak Flow --      Pain Score 02/26/19 1218 0     Pain Loc --      Pain Edu? --      Excl. in GC? --    Updated Vital Signs BP (!) 155/110 (BP Location: Left Arm)   Pulse 86   Temp 97.8 F (36.6 C) (Oral)   Resp 19   Ht 5\' 10"  (1.778 m)   Wt 99.8 kg   SpO2 99%   BMI 31.57 kg/m   Visual Acuity Right Eye Distance:   Left Eye Distance:   Bilateral Distance:    Right Eye Near:   Left Eye Near:    Bilateral Near:     Physical Exam Vitals and nursing note reviewed.  Constitutional:      General: He is not in acute distress.    Appearance: Normal appearance. He is obese. He is not ill-appearing.  HENT:     Head: Normocephalic and atraumatic.  Eyes:     General:        Right eye: No discharge.        Left eye: No discharge.     Conjunctiva/sclera: Conjunctivae normal.  Cardiovascular:     Rate and Rhythm: Normal rate and regular rhythm.     Heart sounds: No murmur.    Pulmonary:     Effort: Pulmonary effort is normal.     Breath sounds: Normal breath sounds. No wheezing, rhonchi or rales.  Neurological:     Mental Status: He is alert.  Psychiatric:        Mood and Affect: Mood normal.        Behavior: Behavior normal.    UC Treatments / Results  Labs (all labs ordered are listed, but only abnormal results are displayed) Labs Reviewed  NOVEL CORONAVIRUS, NAA (HOSP ORDER, SEND-OUT TO REF LAB; TAT 18-24 HRS)    EKG   Radiology No results found.  Procedures Procedures (including critical care time)  Medications Ordered in UC Medications - No data to display  Initial Impression / Assessment and Plan / UC Course  I have reviewed the triage vital signs and the nursing notes.  Pertinent labs & imaging results that were available during my care of the patient were reviewed by me and considered in my medical decision making (see chart for details).    48 year old male presents with cough.  Awaiting Covid test results.  Hycodan for cough.  Work note given.  Supportive care.  No indications for imaging or antibiotics at this time.  Final Clinical Impressions(s) / UC Diagnoses   Final diagnoses:  Cough  Encounter for laboratory testing for COVID-19 virus     Discharge Instructions     Rest.  Fluids.  Cough medication as needed.  [facility]  Dr. 44    ED Prescriptions    Medication Sig Dispense Auth. Provider   HYDROcodone-homatropine (HYCODAN) 5-1.5 MG/5ML syrup Take 5 mLs by mouth every 6 (six) hours as needed. 120 mL Adriana Simas, DO     PDMP not reviewed this encounter.   Tommie Sams, DO 02/26/19 1340

## 2019-02-26 NOTE — ED Triage Notes (Signed)
Cough x one week. "When I get around warm steam like in the shower, it gets worse." Not sleeping well.

## 2019-02-26 NOTE — Discharge Instructions (Signed)
Rest.  Fluids.  Cough medication as needed.  [facility]  Dr. Lacinda Axon

## 2019-02-27 ENCOUNTER — Telehealth: Payer: Self-pay | Admitting: Emergency Medicine

## 2019-02-27 LAB — NOVEL CORONAVIRUS, NAA (HOSP ORDER, SEND-OUT TO REF LAB; TAT 18-24 HRS): SARS-CoV-2, NAA: DETECTED — AB

## 2019-02-27 NOTE — Telephone Encounter (Signed)
Your test for COVID-19 was positive, meaning that you were infected with the novel coronavirus and could give the germ to others.  Please continue isolation at home for at least 10 days since the start of your symptoms. If you do not have symptoms, please isolate at home for 10 days from the day you were tested. Once you complete your 10 day quarantine, you may return to normal activities as long as you've not had a fever for over 24 hours(without taking fever reducing medicine) and your symptoms are improving. Please continue good preventive care measures, including:  frequent hand-washing, avoid touching your face, cover coughs/sneezes, stay out of crowds and keep a 6 foot distance from others.  Go to the nearest hospital emergency room if fever/cough/breathlessness are severe or illness seems like a threat to life.  Attempted to reach patient. No answer at this time. Voicemail is full, unable to leave message. Mychart note sent.  If you have any questions, you may call me at [phone]

## 2019-03-20 ENCOUNTER — Ambulatory Visit
Admission: EM | Admit: 2019-03-20 | Discharge: 2019-03-20 | Disposition: A | Discharge status: Home / Self Care | Payer: BC Managed Care – PPO | Attending: Family Medicine | Admitting: Family Medicine

## 2019-03-20 ENCOUNTER — Other Ambulatory Visit: Payer: Self-pay

## 2019-03-20 ENCOUNTER — Ambulatory Visit (INDEPENDENT_AMBULATORY_CARE_PROVIDER_SITE_OTHER): Payer: BC Managed Care – PPO

## 2019-03-20 DIAGNOSIS — R109 Unspecified abdominal pain: Secondary | ICD-10-CM

## 2019-03-20 DIAGNOSIS — M940 Chondrocostal junction syndrome [Tietze]: Secondary | ICD-10-CM | POA: Diagnosis not present

## 2019-03-20 DIAGNOSIS — J189 Pneumonia, unspecified organism: Secondary | ICD-10-CM

## 2019-03-20 DIAGNOSIS — S39011A Strain of muscle, fascia and tendon of abdomen, initial encounter: Secondary | ICD-10-CM

## 2019-03-20 DIAGNOSIS — R05 Cough: Secondary | ICD-10-CM | POA: Diagnosis not present

## 2019-03-20 MED ORDER — HYDROCOD POLST-CPM POLST ER 10-8 MG/5ML PO SUER: 5.0000 mL | Freq: Two times a day (BID) | ORAL | 0 refills | Status: DC | PRN

## 2019-03-20 MED ORDER — AZITHROMYCIN 250 MG PO TABS: ORAL_TABLET | ORAL | 0 refills | Status: DC

## 2019-03-20 MED ORDER — AMOXICILLIN-POT CLAVULANATE ER 1000-62.5 MG PO TB12: 2.0000 | ORAL_TABLET | Freq: Two times a day (BID) | ORAL | 0 refills | Status: AC

## 2019-03-20 NOTE — Discharge Instructions (Addendum)
Rest, fluids, tylenol as needed °

## 2019-03-20 NOTE — ED Triage Notes (Signed)
Pt presents with c/o lower abdominal pain. He has been coughing frequently due to recent POS COVID (02/26/19). He states this past Friday he was coughing and then began to have immediate lower abdomen pain, he states "it feels like a broken rib". He has been taking Dayquil for his cough, with little to no improvement. He denies any other symptoms or fever.

## 2019-03-20 NOTE — ED Provider Notes (Signed)
[facility]    CSN: [id number] Arrival date & time: 03/20/19  1216      History   Chief Complaint Chief Complaint  Patient presents with  . Cough  . Abdominal Pain    lower left    HPI Tracy Villarreal is a 49 y.o. male.   49 yo male with a c/o chronic cough since positive covid diagnosis on 02/26/19. States that 5 days ago he was coughing very forcefully and developed sudden onset of abdominal pain on the left side and left sided chest wall pain. States he felt like he "broke a rib". Has been taking dayquil with minimal relief. Denies any fevers, chills, shortness of breath.    Cough Abdominal Pain Associated symptoms: cough     Past Medical History:  Diagnosis Date  . Allergies   . Arthritis    BIL FEET  . Collapse of right lung    YEARS AGO  . GERD (gastroesophageal reflux disease)    RARE-NO MEDS  . History of kidney stones    H/O    Patient Active Problem List   Diagnosis Date Noted  . Umbilical hernia without obstruction and without gangrene   . Gout 02/17/2016  . Hearing loss 02/17/2016    Past Surgical History:  Procedure Laterality Date  . NO PAST SURGERIES    . UMBILICAL HERNIA REPAIR N/A 12/09/2017   Procedure: HERNIA REPAIR UMBILICAL ADULT;  Surgeon: Ancil Linsey, MD;  Location: [facility] ORS;  Service: Vascular;  Laterality: N/A;       Home Medications    Prior to Admission medications   Medication Sig Start Date End Date Taking? Authorizing Provider  fluticasone (FLONASE) 50 MCG/ACT nasal spray Place 1 spray into both nostrils daily.  11/28/17  Yes [provider]  Melatonin 5 MG TABS Take by mouth at bedtime.   Yes [provider]  amoxicillin-clavulanate (AUGMENTIN XR) 1000-62.5 MG 12 hr tablet Take 2 tablets by mouth 2 (two) times daily for 7 days. 03/20/19 03/27/19  Payton Mccallum, MD  azithromycin (ZITHROMAX Z-PAK) 250 MG tablet 2 tabs po once day 1, then 1 tab po qd for next 4 days 03/20/19   Payton Mccallum, MD  chlorpheniramine-HYDROcodone [facility] PENNKINETIC ER) 10-8 MG/5ML SUER Take 5 mLs by mouth every 12 (twelve) hours as needed. 03/20/19   Payton Mccallum, MD  HYDROcodone-homatropine [facility]) 5-1.5 MG/5ML syrup Take 5 mLs by mouth every 6 (six) hours as needed. 02/26/19   Tommie Sams, DO    Family History Family History  Problem Relation Age of Onset  . Skin cancer Mother   . Cancer Sister   . Cancer Paternal Grandfather   . Prostate cancer Neg Hx   . Bladder Cancer Neg Hx   . Kidney cancer Neg Hx     Social History Social History   Tobacco Use  . Smoking status: Never Smoker  . Smokeless tobacco: Never Used  Substance Use Topics  . Alcohol use: Yes    Alcohol/week: 8.0 standard drinks    Types: 8 Shots of liquor per week    Comment: OCC  . Drug use: No     Allergies   Patient has no known allergies.   Review of Systems Review of Systems  Respiratory: Positive for cough.   Gastrointestinal: Positive for abdominal pain.     Physical Exam Triage Vital Signs ED Triage Vitals  Enc Vitals Group     BP 03/20/19 1227 (!) 144/93  Pulse Rate 03/20/19 1227 95     Resp 03/20/19 1227 18     Temp 03/20/19 1227 98.5 F (36.9 C)     Temp Source 03/20/19 1227 Oral     SpO2 03/20/19 1227 100 %     Weight 03/20/19 1225 206 lb (93.4 kg)     Height 03/20/19 1225 5\' 10"  (1.778 m)     Head Circumference --      Peak Flow --      Pain Score 03/20/19 1225 8     Pain Loc --      Pain Edu? --      Excl. in [place]? --    No data found.  Updated Vital Signs BP (!) 144/93 (BP Location: Left Arm)   Pulse 95   Temp 98.5 F (36.9 C) (Oral)   Resp 18   Ht 5\' 10"  (1.778 m)   Wt 93.4 kg   SpO2 100%   BMI 29.56 kg/m   Visual Acuity Right Eye Distance:   Left Eye Distance:   Bilateral Distance:    Right Eye Near:   Left Eye Near:    Bilateral Near:     Physical Exam Vitals and nursing note reviewed.  Constitutional:      General: He is not in acute  distress.    Appearance: He is not toxic-appearing or diaphoretic.  Cardiovascular:     Rate and Rhythm: Normal rate.  Pulmonary:     Effort: Pulmonary effort is normal. No respiratory distress.     Breath sounds: Normal breath sounds.  Chest:     Chest wall: Tenderness (left chest wall) present.  Abdominal:     General: Bowel sounds are normal. There is no distension.     Palpations: Abdomen is soft.     Tenderness: There is abdominal tenderness (left upper abdominal wall; no rebound or guarding).  Neurological:     Mental Status: He is alert.      UC Treatments / Results  Labs (all labs ordered are listed, but only abnormal results are displayed) Labs Reviewed - No data to display  EKG   Radiology DG Chest 2 View  Result Date: 03/20/2019 CLINICAL DATA:  49 year old who tested positive for COVID-19 on 02/26/2019 with recent worsening of cough and acute LOWER abdominal pain. EXAM: CHEST - 2 VIEW COMPARISON:  12/01/2007. FINDINGS: Cardiomediastinal silhouette unremarkable and unchanged. Mild patchy opacity in the RIGHT MIDDLE LOBE. Lungs otherwise clear. Pulmonary vascularity normal. Bronchovascular markings normal. No pleural effusions. Visualized bony thorax unremarkable. IMPRESSION: Mild atelectasis and/or pneumonia involving the RIGHT MIDDLE LOBE. Electronically Signed   By: Evangeline Dakin M.D.   On: 03/20/2019 13:25   DG Abd 2 Views  Result Date: 03/20/2019 CLINICAL DATA:  49 year old who tested positive for COVID-19 on 02/26/2019 with recent worsening of cough and acute LOWER abdominal pain. EXAM: ABDOMEN - 2 VIEW COMPARISON:  None. FINDINGS: Bowel gas pattern unremarkable without evidence of obstruction or significant ileus. No evidence of free air or significant air-fluid levels on the erect image. Expected stool burden in the colon. No visible opaque urinary tract calculi. Regional skeleton unremarkable. IMPRESSION: No acute abdominal abnormality. Electronically Signed    By: Evangeline Dakin M.D.   On: 03/20/2019 13:28    Procedures Procedures (including critical care time)  Medications Ordered in UC Medications - No data to display  Initial Impression / Assessment and Plan / UC Course  I have reviewed the triage vital signs and the nursing notes.  Pertinent labs & imaging results that were available during my care of the patient were reviewed by me and considered in my medical decision making (see chart for details).      Final Clinical Impressions(s) / UC Diagnoses   Final diagnoses:  Community acquired pneumonia of right middle lobe of lung  Costochondritis  Strain of abdominal muscle, initial encounter     Discharge Instructions     Rest, fluids, tylenol as needed    ED Prescriptions    Medication Sig Dispense Auth. Provider   amoxicillin-clavulanate (AUGMENTIN XR) 1000-62.5 MG 12 hr tablet Take 2 tablets by mouth 2 (two) times daily for 7 days. 28 tablet Latoya Diskin, Pamala Hurry, MD   azithromycin (ZITHROMAX Z-PAK) 250 MG tablet 2 tabs po once day 1, then 1 tab po qd for next 4 days 6 each Payton Mccallum, MD   chlorpheniramine-HYDROcodone (TUSSIONEX PENNKINETIC ER) 10-8 MG/5ML SUER Take 5 mLs by mouth every 12 (twelve) hours as needed. 60 mL Payton Mccallum, MD      1. x-ray results and diagnosis reviewed with patient 2. rx as per orders above; reviewed possible side effects, interactions, risks and benefits  3. Recommend supportive treatment as above 4. Recommend follow up with PCP in 3-4 weeks for recheck chest x-ray  4. Follow-up sooner prn if symptoms worsen or don't improve   I have reviewed the PDMP during this encounter.   Payton Mccallum, MD 03/20/19 [id number]

## 2019-03-26 ENCOUNTER — Other Ambulatory Visit: Payer: Self-pay

## 2019-03-26 ENCOUNTER — Ambulatory Visit
Admission: EM | Admit: 2019-03-26 | Discharge: 2019-03-26 | Disposition: A | Discharge status: Home / Self Care | Payer: BC Managed Care – PPO | Attending: Urgent Care | Admitting: Urgent Care

## 2019-03-26 ENCOUNTER — Ambulatory Visit (INDEPENDENT_AMBULATORY_CARE_PROVIDER_SITE_OTHER): Payer: BC Managed Care – PPO

## 2019-03-26 DIAGNOSIS — R059 Cough, unspecified: Secondary | ICD-10-CM

## 2019-03-26 DIAGNOSIS — S2232XA Fracture of one rib, left side, initial encounter for closed fracture: Secondary | ICD-10-CM | POA: Diagnosis not present

## 2019-03-26 DIAGNOSIS — R05 Cough: Secondary | ICD-10-CM

## 2019-03-26 DIAGNOSIS — J189 Pneumonia, unspecified organism: Secondary | ICD-10-CM

## 2019-03-26 DIAGNOSIS — J181 Lobar pneumonia, unspecified organism: Secondary | ICD-10-CM | POA: Diagnosis not present

## 2019-03-26 MED ORDER — BENZONATATE 200 MG PO CAPS: 200.0000 mg | ORAL_CAPSULE | Freq: Three times a day (TID) | ORAL | 0 refills | Status: DC | PRN

## 2019-03-26 MED ORDER — OXYCODONE-ACETAMINOPHEN 5-325 MG PO TABS: 1.0000 | ORAL_TABLET | Freq: Three times a day (TID) | ORAL | 0 refills | Status: DC | PRN

## 2019-03-26 NOTE — ED Triage Notes (Signed)
Pt states COVID on Dec 21, then subsequent pneumonia. 2 more antibiotic pills but still terrible cough. Today he thinks he coughed so hard he broke a rib. Left side

## 2019-03-26 NOTE — Discharge Instructions (Signed)
It was very nice seeing you today in clinic. Thank you for entrusting me with your care.   You have a fracture on your 8th rib on the LEFT side. Make efforts to cough and deep breathe frequently to prevent the development of a pneumonia on this side as well. Continue antibiotics as previously prescribed. Use pain medication; no driving. I will send in a different cough medication as well to help with your cough that will not make you sleepy.   Make arrangements to follow up with your regular doctor in 1 week for re-evaluation if not improving. If your symptoms/condition worsens, please seek follow up care either here or in the ER. Please remember, our [facility] providers are "right here with you" when you need [place].   Again, it was my pleasure to take care of you today. Thank you for choosing our clinic. I hope that you start to feel better quickly.   Quentin Mulling, MSN, APRN, FNP-C, CEN Advanced Practice Provider  [facility]

## 2019-03-26 NOTE — ED Provider Notes (Signed)
[place], [place]   Name: Tracy Villarreal DOB: [birth year] MRN: [id number] CSN: [id number] PCP: Gaynelle Arabian, MD  Arrival date and time:  03/26/19 1101  Chief Complaint:  Cough   NOTE: Prior to seeing the patient today, I have reviewed the triage nursing documentation and vital signs. Clinical staff has updated patient's PMH/PSHx, current medication list, and drug allergies/intolerances to ensure comprehensive history available to assist in medical decision making.   History:   HPI: Tracy Villarreal is a 49 y.o. male who presents today with complaints of cough and LEFT posterior rib pain. Cough has been persistent since patient was diagnosed with pneumonia on 02/26/2019. Patient was subsequently seen here on 03/20/2019 by Dr. Zenda Alpers; notes reviewed. Patient diagnosed with RML pneumonia and prescribed a 7 day course of amoxicillin-clavulanate XR and a 5 day course of azithromycin. Additionally, he was given a supply of Tussionex for PRN use. Patient presents today with continued symptoms. He denies any fevers. Patient advising that at 0900 this morning, he experienced a forceful coughing episode that resulted in a loud pop in his LEFT posterior ribs that "almost brought him to his knees". Patient presents to clinic today with complaints of significant pain that is worsened by deep inspiration and movement. No shortness of breath or increased work of breathing; SPO2 100% on RA. Patient speaking in halted patter due to associated pain in his ribs. Blood pressure elevated at 130/112 presumably due to pain 144/93 during visit last week.   Past Medical History:  Diagnosis Date  . Allergies   . Arthritis    BIL FEET  . Collapse of right lung    YEARS AGO  . GERD (gastroesophageal reflux disease)    RARE-NO MEDS  . History of kidney stones    H/O    Past Surgical History:  Procedure Laterality Date  . NO PAST SURGERIES    . UMBILICAL HERNIA REPAIR N/A 12/09/2017   Procedure: HERNIA  REPAIR UMBILICAL ADULT;  Surgeon: Vickie Epley, MD;  Location: [facility] ORS;  Service: Vascular;  Laterality: N/A;    Family History  Problem Relation Age of Onset  . Skin cancer Mother   . Cancer Sister   . Cancer Paternal Grandfather   . Prostate cancer Neg Hx   . Bladder Cancer Neg Hx   . Kidney cancer Neg Hx     Social History   Tobacco Use  . Smoking status: Never Smoker  . Smokeless tobacco: Never Used  Substance Use Topics  . Alcohol use: Yes    Alcohol/week: 8.0 standard drinks    Types: 8 Shots of liquor per week    Comment: OCC  . Drug use: No    Patient Active Problem List   Diagnosis Date Noted  . Umbilical hernia without obstruction and without gangrene   . Gout 02/17/2016  . Hearing loss 02/17/2016    Home Medications:    No outpatient medications have been marked as taking for the 03/26/19 encounter [facility] Encounter).    Allergies:   Patient has no known allergies.  Review of Systems (ROS): Review of Systems  Constitutional: Negative for chills and fever.  HENT: Negative for congestion, rhinorrhea and sinus pressure.   Respiratory: Negative for cough and shortness of breath.   Cardiovascular: Positive for chest pain (LEFT posterior ribs). Negative for palpitations.  Gastrointestinal: Negative for nausea and vomiting.  Musculoskeletal: Negative for back pain, myalgias and neck pain.  Neurological: Negative for dizziness, syncope, weakness and headaches.  Psychiatric/Behavioral: The  patient is nervous/anxious (2/2 acute pain).   All other systems reviewed and are negative.    Vital Signs: Today's Vitals   03/26/19 1118 03/26/19 1120 03/26/19 1209 03/26/19 1256  BP: (!) 130/112     Pulse: 85     Resp: 20     Temp: (!) 97.5 F (36.4 C)     TempSrc: Oral     SpO2: 100%     Weight:  205 lb 14.6 oz (93.4 kg)    Height:  5\' 10"  (1.778 m)    PainSc:   8  8     Physical Exam: Physical Exam  Constitutional: He is oriented to person, place,  and time and well-developed, well-nourished, and in no distress.  (+) obvious discomfort noted with deep inspiration and movement.   HENT:  Head: Normocephalic and atraumatic.  Eyes: Pupils are equal, round, and reactive to light.  Cardiovascular: Normal rate, regular rhythm, normal heart sounds and intact distal pulses.  Pulmonary/Chest: Effort normal and breath sounds normal. He exhibits tenderness (LEFT posterior). He exhibits no crepitus, no edema and no deformity.  Mild cough noted in clinic. No SOB or increased WOB. RR increased with movement. No acute distress. Speaks in complete sentences, however pattern halted 2/2 pain in ribs. SPO2 100% on RA.  Neurological: He is alert and oriented to person, place, and time. Gait normal.  Skin: Skin is warm and dry. No rash noted. He is not diaphoretic.  Psychiatric: Memory, affect and judgment normal. His mood appears anxious.  Nursing note and vitals reviewed.   Urgent Care Treatments / Results:   Orders Placed This Encounter  Procedures  . DG Ribs Unilateral W/Chest Left    LABS: PLEASE NOTE: all labs that were ordered this encounter are listed, however only abnormal results are displayed. Labs Reviewed - No data to display  EKG: -None  RADIOLOGY: DG Ribs Unilateral W/Chest Left  Result Date: 03/26/2019 CLINICAL DATA:  49 year old who tested positive for COVID-19 on 02/26/2019, recently diagnosed with pneumonia involving the RIGHT lung base, presenting with persistent cough and an audible pop from a LEFT rib with associated pain when coughing earlier today. EXAM: LEFT RIBS AND CHEST - 3+ VIEW COMPARISON:  03/20/2019 and earlier. FINDINGS: The site of maximum pain and tenderness was marked with a metallic BB. Acute nondisplaced fracture involving the LEFT posterolateral eighth rib. No rib fractures elsewhere. Included PA chest demonstrates persistent streaky opacities at the RIGHT lung base, unchanged since the examination 6 days ago. No  new pulmonary parenchymal abnormalities. Cardiomediastinal silhouette unremarkable and unchanged. IMPRESSION: 1. Acute nondisplaced fracture involving the LEFT posterolateral eighth rib. 2. Stable bronchopneumonia involving the RIGHT lung base. Electronically Signed   By: 05/18/2019 M.D.   On: 03/26/2019 12:25    PROCEDURES: Procedures  MEDICATIONS RECEIVED THIS VISIT: Medications - No data to display  PERTINENT CLINICAL COURSE NOTES/UPDATES:   Initial Impression / Assessment and Plan / Urgent Care Course:  Pertinent labs & imaging results that were available during my care of the patient were personally reviewed by me and considered in my medical decision making (see lab/imaging section of note for values and interpretations).  Tracy Villarreal is a 49 y.o. male who presents to [facility] today with complaints of Cough   Patient is well appearing overall in clinic today. He does not appear to be in any acute distress. Presenting symptoms (see HPI) and exam as documented above. Diagnostic radiographs of the chest with dedicated rib series  revealed a stable pneumonia on the RIGHT, in addition to an acute non-displaced 8th posterior rib on the LEFT. There was no evidence of PTX. Discussed findings with patient. Encouraged frequent TCDB exercises/pulmonary hygiene to prevent worsening of his current pneumonia and development of a contralateral pneumonia. Tussionex has been ineffective. Given his acute rib fracture, will discontinue Tussionex and change to a supply of benzonatate (Tessalon) for patient to use on a PRN basis to help with his cough. This will allow patient to have intervention in place more frequently and will prevent oversedation when combined with the pain medication provided for rib injury. In efforts to control pain associated with acute rib fracture, will send in a supply of Percocet 5/325 mg tablets for PRN use; indications and side effects reviewed. He was advised  to continue the antibiotics as previously prescribed.   Current clinical condition warrants patient being out of work in order to recover from his current injury/illness. He was provided with the appropriate documentation to provide to his place of employment that will allow for him to RTW on 03/29/19 with no restrictions.   Discussed follow up with primary care physician in 1 week for re-evaluation. I have reviewed the follow up and strict return precautions for any new or worsening symptoms. Patient is aware of symptoms that would be deemed urgent/emergent, and would thus require further evaluation either here or in the emergency department. At the time of discharge, he verbalized understanding and consent with the discharge plan as it was reviewed with him. All questions were fielded by provider and/or clinic staff prior to patient discharge.    Final Clinical Impressions / Urgent Care Diagnoses:   Final diagnoses:  Closed fracture of one rib of left side, initial encounter  Cough  Community acquired pneumonia of right lung, unspecified part of lung    New Prescriptions:  [place] Controlled Substance Registry consulted? Yes, I have consulted the [place] Controlled Substances Registry for this patient, and feel the risk/benefit ratio today is favorable for proceeding with this prescription for a controlled substance.  . Discussed use of controlled substance medication to treat his acute pain.  o Reviewed [place] STOP Act regulations  o Clinic does not refill controlled substances over the phone without face to face evaluation.  . Safety precautions reviewed.  o Medications should not be bitten, chewed, sold, or taken with alcohol.  o Avoid use while working, driving, or operating heavy machinery.  o Side effects associated with the use of this particular medication reviewed. - Patient understands that this medication can cause CNS depression, increase his risk of falls, and even lead to overdose that may  result in death, if used outside of the parameters that he and I discussed.  With all of this in mind, he knowingly accepts the risks and responsibilities associated with intended course of treatment, and elects to responsibly proceed as discussed.  Meds ordered this encounter  Medications  . benzonatate (TESSALON) 200 MG capsule    Sig: Take 1 capsule (200 mg total) by mouth 3 (three) times daily as needed for cough.    Dispense:  21 capsule    Refill:  0  . oxyCODONE-acetaminophen (PERCOCET) 5-325 MG tablet    Sig: Take 1 tablet by mouth every 8 (eight) hours as needed for severe pain.    Dispense:  15 tablet    Refill:  0    Recommended Follow up Care:  Patient encouraged to follow up with the following provider within the specified  time frame, or sooner as dictated by the severity of his symptoms. As always, he was instructed that for any urgent/emergent care needs, he should seek care either here or in the emergency department for more immediate evaluation.  Follow-up Information    Blair Heys, MD In 1 week.   Specialty: Family Medicine Why: General reassessment of symptoms if not improving Contact information: [address] [place] [address] [phone]         NOTE: This note was prepared using Dragon dictation software along with smaller phrase technology. Despite my best ability to proofread, there is the potential that transcriptional errors may still occur from this process, and are completely unintentional.    Verlee Monte, NP 03/27/19 0800

## 2019-04-21 ENCOUNTER — Ambulatory Visit (INDEPENDENT_AMBULATORY_CARE_PROVIDER_SITE_OTHER)
Admission: RE | Admit: 2019-04-21 | Discharge: 2019-04-21 | Disposition: A | Discharge status: Home / Self Care | Payer: BC Managed Care – PPO | Source: Ambulatory Visit

## 2019-04-21 DIAGNOSIS — M109 Gout, unspecified: Secondary | ICD-10-CM | POA: Diagnosis not present

## 2019-04-21 MED ORDER — PREDNISONE 10 MG (21) PO TBPK: ORAL_TABLET | Freq: Every day | ORAL | 0 refills | Status: DC

## 2019-04-21 NOTE — Discharge Instructions (Addendum)
Take steroid as directed. Avoid NSAIDs while taking this medication. Return for worsening pain, redness, fever, body aches.

## 2019-04-21 NOTE — ED Provider Notes (Signed)
Virtual Visit via Video Note:  Tracy Villarreal  initiated request for Telemedicine visit with [facility] team. I connected with Tracy Villarreal  on 04/21/2019 at 8:58 AM  for a synchronized telemedicine visit using a video enabled HIPPA compliant telemedicine application. I verified that I am speaking with Tracy Villarreal  using two identifiers. [name], PA-C  was physically located in a [facility] Urgent care site and Tracy Villarreal was located at a different location.   The limitations of evaluation and management by telemedicine as well as the availability of in-person appointments were discussed. Patient was informed that he  may incur a bill ( including co-pay) for this virtual visit encounter. Tracy Villarreal  expressed understanding and gave verbal consent to proceed with virtual visit.     History of Present Illness:Tracy Villarreal  is a 49 y.o. male presents with concern for gout flare of left great toe at MTP.  States pain began Wednesday night and has worsened since.  Now having some focal swelling as compared to right foot and difficulty ambulating.  Patient does endorse history of gout flare, last flare was several years ago.  Was treated with colchicine by his PCP at that time with adequate resolution.  Denies recent change in diet, lifestyle, medications.  Does endorse alcohol intake: 2-3 shots every 4 days.  Denies trauma, injury, wound to foot.  Has taken ibuprofen without significant relief.   Past Medical History:  Diagnosis Date  . Allergies   . Arthritis    BIL FEET  . Collapse of right lung    YEARS AGO  . GERD (gastroesophageal reflux disease)    RARE-NO MEDS  . History of kidney stones    H/O    No Known Allergies      Observations/Objective: 49 y.o. male sitting in no acute distress.  Patient is able to speak in full sentences without coughing, sneezing, wheezing.  Patient is able to show both feet without socks:  Left foot is edematous over MTP as compared to right.  No appreciable erythema, though patient reports exquisite TTP.  Able to move all toes.  Assessment and Plan: H&P consistent with acute gout flare of left great toe.  Will start prednisone today, follow-up with PCP as needed.  Return precautions discussed, patient verbalized understanding and is agreeable to plan.  Follow Up Instructions: Patient to seek in-person evaluation for persistent, worsening symptoms.   I discussed the assessment and treatment plan with the patient. The patient was provided an opportunity to ask questions and all were answered. The patient agreed with the plan and demonstrated an understanding of the instructions.   The patient was advised to call back or seek an in-person evaluation if the symptoms worsen or if the condition fails to improve as anticipated.  I provided 15 minutes of non-face-to-face time during this encounter.    [name], PA-C  04/21/2019 8:58 AM        Hall-Potvin, [place], PA-C 04/21/19 [phone]

## 2019-09-19 ENCOUNTER — Ambulatory Visit
Admission: EM | Admit: 2019-09-19 | Discharge: 2019-09-19 | Disposition: A | Discharge status: Home / Self Care | Payer: BC Managed Care – PPO | Attending: Family Medicine | Admitting: Family Medicine

## 2019-09-19 ENCOUNTER — Ambulatory Visit (INDEPENDENT_AMBULATORY_CARE_PROVIDER_SITE_OTHER): Payer: BC Managed Care – PPO

## 2019-09-19 ENCOUNTER — Encounter: Payer: Self-pay | Admitting: Emergency Medicine

## 2019-09-19 ENCOUNTER — Other Ambulatory Visit: Payer: Self-pay

## 2019-09-19 DIAGNOSIS — M79672 Pain in left foot: Secondary | ICD-10-CM

## 2019-09-19 MED ORDER — PREDNISONE 10 MG PO TABS: ORAL_TABLET | ORAL | 0 refills | Status: DC

## 2019-09-19 NOTE — Discharge Instructions (Signed)
Take medication as prescribed. Post op shoe. Rest. Ice. Elevate.   Follow-up with podiatry as discussed.  Follow up with your primary care physician this week as needed. Return to Urgent care for new or worsening concerns.

## 2019-09-19 NOTE — ED Triage Notes (Signed)
Patient c/o left foot pain that started on Monday. He denies injury.

## 2019-09-19 NOTE — ED Provider Notes (Signed)
[facility] ____________________________________________  Time seen: Approximately 12:46 PM  I have reviewed the triage vital signs and the nursing notes.   HISTORY  Chief Complaint Foot Pain   HPI Tracy Villarreal is a 49 y.o. male presenting for evaluation of left foot pain present for last 2 days.  Denies fall, direct injury or known trauma.  States was resting over the weekend and unsure of pain reason for starting.  Pain present since Monday.  States does have a history of gout to left foot but at that toe.  Denies any other pain, pain radiation or paresthesias.   Painful to walk on the area. Denies break in skin or redness.  Has tried resting without resolution.  Denies aggravating alleviating factors.   Blair Heys, MD : PCP  Past Medical History:  Diagnosis Date   Allergies    Arthritis    BIL FEET   Collapse of right lung    YEARS AGO   GERD (gastroesophageal reflux disease)    RARE-NO MEDS   History of kidney stones    H/O    Patient Active Problem List   Diagnosis Date Noted   Umbilical hernia without obstruction and without gangrene    Gout 02/17/2016   Hearing loss 02/17/2016    Past Surgical History:  Procedure Laterality Date   NO PAST SURGERIES     UMBILICAL HERNIA REPAIR N/A 12/09/2017   Procedure: HERNIA REPAIR UMBILICAL ADULT;  Surgeon: Ancil Linsey, MD;  Location: [facility] ORS;  Service: Vascular;  Laterality: N/A;     No current facility-administered medications for this encounter.  Current Outpatient Medications:    amLODipine (NORVASC) 5 MG tablet, Take by mouth., Disp: , Rfl:    fluticasone (FLONASE) 50 MCG/ACT nasal spray, Place 1 spray into both nostrils daily. , Disp: , Rfl: 6   Melatonin 5 MG TABS, Take by mouth at bedtime., Disp: , Rfl:    predniSONE (DELTASONE) 10 MG tablet, Start 60 mg po day one, then 50 mg po day two, taper by 10 mg daily until complete., Disp: 21 tablet, Rfl:  0  Allergies Patient has no known allergies.  Family History  Problem Relation Age of Onset   Skin cancer Mother    Cancer Sister    Cancer Paternal Grandfather    Prostate cancer Neg Hx    Bladder Cancer Neg Hx    Kidney cancer Neg Hx     Social History Social History   Tobacco Use   Smoking status: Never Smoker   Smokeless tobacco: Never Used  Vaping Use   Vaping Use: Never used  Substance Use Topics   Alcohol use: Yes    Alcohol/week: 8.0 standard drinks    Types: 8 Shots of liquor per week    Comment: OCC   Drug use: No    Review of Systems Constitutional: No fever Cardiovascular: Denies chest pain. Respiratory: Denies shortness of breath. Musculoskeletal: Positive left foot pain. Skin: Negative for rash. Neurological: Negative for focal weakness or numbness.   ____________________________________________   PHYSICAL EXAM:  VITAL SIGNS: ED Triage Vitals  Enc Vitals Group     BP 09/19/19 1214 (!) 144/94     Pulse Rate 09/19/19 1214 78     Resp 09/19/19 1214 18     Temp 09/19/19 1214 98 F (36.7 C)     Temp Source 09/19/19 1214 Oral     SpO2 09/19/19 1214 98 %     Weight 09/19/19 1214 205 lb 14.6  oz (93.4 kg)     Height 09/19/19 1214 5\' 10"  (1.778 m)     Head Circumference --      Peak Flow --      Pain Score 09/19/19 1213 10     Pain Loc --      Pain Edu? --      Excl. in GC? --     Constitutional: Alert and oriented. Well appearing and in no acute distress. Eyes: Conjunctivae are normal.  ENT      Head: Normocephalic and atraumatic. Cardiovascular: Good peripheral circulation. Respiratory: Normal respiratory effort without tachypnea nor retractions Musculoskeletal: Ambulatory with mild antalgic gait.  Bilateral distal pedal pulses equal and easily palpated. Except: Left medial lower malleolus and medial heel tenderness to direct palpation, no ecchymosis, mild localized edema, mild pain along plantar aspect of medial heel, no  erythema, skin intact, full range of motion present, no Achilles tenderness.  Left foot normal distal sensation. Neurologic:  Normal speech and language. No gross focal neurologic deficits are appreciated.  Skin:  Skin is warm, dry and intact. No rash noted. Psychiatric: Mood and affect are normal. Speech and behavior are normal. Patient exhibits appropriate insight and judgment   ___________________________________________   LABS (all labs ordered are listed, but only abnormal results are displayed)  Labs Reviewed - No data to display  RADIOLOGY  DG Foot Complete Left  Result Date: 09/19/2019 CLINICAL DATA:  Foot pain. EXAM: LEFT FOOT - COMPLETE 3+ VIEW COMPARISON:  None. FINDINGS: The joint spaces are maintained. 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 The MTP joints are maintained and the mid and hindfoot joint spaces are normal. No acute fracture is identified. Moderate spurring changes at the calcaneal attachment site of the Achilles tendon. Mild pes cavus. IMPRESSION: 1. No acute bony findings. 2. 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 Electronically Signed   By: 09/21/2019 M.D.   On: 09/19/2019 13:02   ____________________________________________   PROCEDURES Procedures   INITIAL IMPRESSION / ASSESSMENT AND PLAN / ED COURSE  Pertinent labs & imaging results that were available during my care of the patient were reviewed by me and considered in my medical decision making (see chart for details).  Well-appearing patient.  No acute distress.  Left foot pain, as above, nontraumatic.  Discussed multiple differentials including gout versus tendinitis.  Left foot x-ray no acute findings, mild degenerative changes, moderate spurring changes at the site of the Achilles tendon.  Will treat with oral prednisone taper, ice, elevation,  rest.  Postop shoe, patient states he has this at home.  Encouraged podiatry follow-up.Discussed indication, risks and benefits of medications with patient.   Discussed follow up with Primary care physician this week. Discussed follow up and return parameters including no resolution or any worsening concerns. Patient verbalized understanding and agreed to plan.   ____________________________________________   FINAL CLINICAL IMPRESSION(S) / ED DIAGNOSES  Final diagnoses:  Left foot pain     ED Discharge Orders         Ordered    predniSONE (DELTASONE) 10 MG tablet     Discontinue  Reprint     09/19/19 1312           Note: This dictation was prepared with Dragon dictation along with smaller phrase technology. Any transcriptional errors that result from this process are unintentional.  Renford Dills, NP 09/19/19 1357

## 2019-11-16 ENCOUNTER — Ambulatory Visit
Admission: EM | Admit: 2019-11-16 | Discharge: 2019-11-16 | Disposition: A | Discharge status: Home / Self Care | Payer: BC Managed Care – PPO | Attending: Family Medicine | Admitting: Family Medicine

## 2019-11-16 ENCOUNTER — Telehealth: Payer: Self-pay | Admitting: Family Medicine

## 2019-11-16 ENCOUNTER — Other Ambulatory Visit: Payer: Self-pay

## 2019-11-16 ENCOUNTER — Ambulatory Visit
Admission: RE | Admit: 2019-11-16 | Discharge: 2019-11-16 | Disposition: A | Discharge status: Home / Self Care | Payer: BC Managed Care – PPO | Source: Ambulatory Visit | Attending: Family Medicine | Admitting: Family Medicine

## 2019-11-16 DIAGNOSIS — N433 Hydrocele, unspecified: Secondary | ICD-10-CM | POA: Diagnosis not present

## 2019-11-16 DIAGNOSIS — N5089 Other specified disorders of the male genital organs: Secondary | ICD-10-CM | POA: Insufficient documentation

## 2019-11-16 DIAGNOSIS — N50812 Left testicular pain: Secondary | ICD-10-CM | POA: Diagnosis not present

## 2019-11-16 MED ORDER — NAPROXEN 500 MG PO TABS: 500.0000 mg | ORAL_TABLET | Freq: Two times a day (BID) | ORAL | 0 refills | Status: DC

## 2019-11-16 NOTE — ED Provider Notes (Signed)
[facility]    CSN: [id number] Arrival date & time: 11/16/19  1439      History   Chief Complaint Chief Complaint  Patient presents with  . Testicle Pain   HPI  49 year old male presents with the above complaint.  Patient reports left testicle swelling and associated pain for the past 3 weeks.  Has been progressing.  Pain worse today.  States the pain is sharp and is now radiating upward.  Pain 6/10 in severity.  No recent fall, trauma, injury.  No urinary symptoms.  No relieving factors.  No other associated symptoms.  No other complaints.  Past Medical History:  Diagnosis Date  . Allergies   . Arthritis    BIL FEET  . Collapse of right lung    YEARS AGO  . GERD (gastroesophageal reflux disease)    RARE-NO MEDS  . History of kidney stones    H/O   Patient Active Problem List   Diagnosis Date Noted  . Umbilical hernia without obstruction and without gangrene   . Gout 02/17/2016  . Hearing loss 02/17/2016   Past Surgical History:  Procedure Laterality Date  . NO PAST SURGERIES    . UMBILICAL HERNIA REPAIR N/A 12/09/2017   Procedure: HERNIA REPAIR UMBILICAL ADULT;  Surgeon: Ancil Linsey, MD;  Location: [facility] ORS;  Service: Vascular;  Laterality: N/A;    Home Medications    Prior to Admission medications   Medication Sig Start Date End Date Taking? Authorizing Provider  amLODipine (NORVASC) 5 MG tablet Take by mouth.    [provider]  fluticasone (FLONASE) 50 MCG/ACT nasal spray Place 1 spray into both nostrils daily.  11/28/17   [provider]  Melatonin 5 MG TABS Take by mouth at bedtime.    [provider]  naproxen (NAPROSYN) 500 MG tablet Take 1 tablet (500 mg total) by mouth 2 (two) times daily with a meal. 11/16/19   Tommie Sams, DO   Family History Family History  Problem Relation Age of Onset  . Skin cancer Mother   . Cancer Sister   . Cancer Paternal Grandfather   . Prostate cancer Neg Hx   . Bladder  Cancer Neg Hx   . Kidney cancer Neg Hx    Social History Social History   Tobacco Use  . Smoking status: Never Smoker  . Smokeless tobacco: Never Used  Vaping Use  . Vaping Use: Never used  Substance Use Topics  . Alcohol use: Yes    Alcohol/week: 8.0 standard drinks    Types: 8 Shots of liquor per week    Comment: OCC  . Drug use: No     Allergies   Patient has no known allergies.   Review of Systems Review of Systems  Constitutional: Negative.   Genitourinary: Positive for scrotal swelling and testicular pain.   Physical Exam Triage Vital Signs ED Triage Vitals [11/16/19 1511]  Enc Vitals Group     BP (!) 154/95     Pulse Rate 75     Resp 17     Temp 97.8 F (36.6 C)     Temp Source Oral     SpO2 100 %     Weight 220 lb (99.8 kg)     Height 5\' 10"  (1.778 m)     Head Circumference      Peak Flow      Pain Score 6     Pain Loc      Pain Edu?  Excl. in GC?    Updated Vital Signs BP (!) 154/95 (BP Location: Left Arm)   Pulse 75   Temp 97.8 F (36.6 C) (Oral)   Resp 17   Ht 5\' 10"  (1.778 m)   Wt 99.8 kg   SpO2 100%   BMI 31.57 kg/m     Physical Exam Vitals and nursing note reviewed.  Constitutional:      General: He is not in acute distress.    Appearance: Normal appearance. He is not ill-appearing.  HENT:     Head: Normocephalic and atraumatic.  Eyes:     General:        Right eye: No discharge.        Left eye: No discharge.     Conjunctiva/sclera: Conjunctivae normal.  Pulmonary:     Effort: Pulmonary effort is normal. No respiratory distress.  Genitourinary:    Comments: Mild swelling left testicle.  Tender to palpation. Neurological:     Mental Status: He is alert.  Psychiatric:        Mood and Affect: Mood normal.        Behavior: Behavior normal.    UC Treatments / Results  Labs (all labs ordered are listed, but only abnormal results are displayed) Labs Reviewed - No data to display  EKG   Radiology SCROTUM  W/DOPPLER  Result Date: 11/16/2019 CLINICAL DATA:  LEFT testicular pain and swelling for 3 weeks EXAM: SCROTAL ULTRASOUND DOPPLER ULTRASOUND OF THE TESTICLES TECHNIQUE: Complete ultrasound examination of the testicles, epididymis, and other scrotal structures was performed. Color and spectral Doppler ultrasound were also utilized to evaluate blood flow to the testicles. COMPARISON:  None FINDINGS: Right testicle Measurements: 4.7 x 2.3 x 3.0 cm. Normal echogenicity without mass or calcification. Internal blood flow present on color Doppler imaging. Left testicle Measurements: 4.2 x 2.6 x 3.3 cm. Normal echogenicity without mass or calcification. Internal blood flow present on color Doppler imaging. Right epididymis:  Heterogeneous echogenicity without focal mass Left epididymis:  Heterogeneous echogenicity without focal mass Hydrocele:  Small BILATERAL hydroceles Varicocele:  None visualized. Pulsed Doppler interrogation of both testes demonstrates normal low resistance arterial and venous waveforms bilaterally. IMPRESSION: Normal appearing testes and epididymi bilaterally. Small BILATERAL hydroceles. Electronically Signed   By: 01/16/2020 M.D.   On: 11/16/2019 17:31    Procedures Procedures (including critical care time)  Medications Ordered in UC Medications - No data to display  Initial Impression / Assessment and Plan / UC Course  I have reviewed the triage vital signs and the nursing notes.  Pertinent labs & imaging results that were available during my care of the patient were reviewed by me and considered in my medical decision making (see chart for details).    49 year old male presents with left testicle swelling.  Sent for ultrasound.  Ultrasound revealed small lateral hydroceles.  No evidence of torsion or epididymitis.  Patient was informed of the results and was placed on naproxen for pain and swelling.  Supportive care.  Final Clinical Impressions(s) / UC Diagnoses   Final  diagnoses:  Testicular swelling  Pain in left testicle     Discharge Instructions     I will call with the results.  Take care  Dr. 44    ED Prescriptions    None     PDMP not reviewed this encounter.   Adriana Simas, Tommie Sams 11/16/19 1914

## 2019-11-16 NOTE — ED Triage Notes (Signed)
Pt with 3 weeks of left sided testicular pain and swelling. Pain is much worse today.

## 2019-11-16 NOTE — Telephone Encounter (Signed)
Patient notified of results of [place]. No evidence of infection, torsion. Sending in Naproxen for pain and swelling.  [name] DO [facility]

## 2019-11-16 NOTE — Discharge Instructions (Signed)
I will call with the results.  Take care  Dr. Breionna Punt  

## 2019-11-16 NOTE — ED Notes (Signed)
Pt going to [facility] mall for stat u/s

## 2019-12-19 ENCOUNTER — Telehealth: Payer: BC Managed Care – PPO | Admitting: Family

## 2019-12-19 DIAGNOSIS — M109 Gout, unspecified: Secondary | ICD-10-CM | POA: Diagnosis not present

## 2019-12-19 MED ORDER — PREDNISONE 20 MG PO TABS: 40.0000 mg | ORAL_TABLET | Freq: Every day | ORAL | 0 refills | Status: DC

## 2019-12-19 MED ORDER — COLCHICINE 0.6 MG PO TABS: ORAL_TABLET | ORAL | 0 refills | Status: DC

## 2019-12-19 NOTE — Progress Notes (Signed)
E-Visit for Gout  We are sorry that you are not feeling well. We are here to help!  Based on what you shared with me it looks like you have a flare of your gout.  Gout is a form of arthritis. It can cause pain and swelling in the joints. At first, it tends to affect only 1 joint - most frequently the big toe. It happens in people who have too much uric acid in the blood. Uric acid is a chemical that is produced when the body breaks down certain foods. Uric acid can form sharp needle-like crystals that build up in the joints and cause pain. Uric acid crystals can also form inside the tubes that carry urine from the kidneys to the bladder. These crystals can turn into "kidney stones" that can cause pain and problems with the flow of urine. People with gout get sudden "flares" or attacks of severe pain, most often the big toe, ankle, or knee. Often the joint also turns red and swells. Usually, only 1 joint is affected, but some people have pain in more than 1 joint. Gout flares tend to happen more often during the night.  The pain from gout can be extreme. The pain and swelling are worst at the beginning of a gout flare. The symptoms then get better within a few days to weeks. It is not clear how the body "turns off" a gout flare.  Do not start any NEW preventative medicine until the gout has cleared completely. However, If you are already on Probenecid or Allopurinol for CHRONIC gout, you may continue taking this during an active flare up  I have prescribed Colchicine 0.6 mg tabs - Take 2 tabs immediately, then 1 tab twice per day for the duration of the flare up to a max of 7 days (but discontinue for stomach pains or diarrhea)  and I have prescribed Prednisone 40 mg daily for 7   HOME CARE Losing weight can help relieve gout. It's not clear that following a specific diet plan will help with gout symptoms but eating a balanced diet can help improve your overall health. It can also help you lose weight,  if you are overweight. In general, a healthy diet includes plenty of fruits, vegetables, whole grains, and low-fat dairy products (labelled "low fat", skim, 2%). Avoid sugar sweetened drinks (including sodas, tea, juice and juice blends, coffee drinks and sports drinks) Limit alcohol to 1-2 drinks of beer, spirits or wine daily these can make gout flares worse. Some people with gout also have other health problems, such as heart disease, high blood pressure, kidney disease, or obesity. If you have any of these issues, it's important to work with your doctor to manage them. This can help improve your overall health and might also help with your gout.  GET HELP RIGHT AWAY IF: . Your symptoms persist after you have completed your treatment plan . You develop severe diarrhea . You develop abnormal sensations .  You develop vomiting,  .  You develop weakness .  You develop abdominal pain  FOLLOW UP WITH YOUR PRIMARY PROVIDER IF: . If your symptoms do not improve within 10 days  MAKE SURE YOU   Understand these instructions.  Will watch your condition.  Will get help right away if you are not doing well or get worse.  Your e-visit answers were reviewed by a board certified advanced clinical practitioner to complete your personal care plan. Depending upon the condition, your plan could have  included both over the counter or prescription medications.  Your safety is important to [place]. If you have drug allergies check your prescription carefully.   You can use MyChart to ask questions about today's visit, request a non-urgent call back, or ask for a work or school excuse for 24 hours related to this e-Visit. If it has been greater than 24 hours you will need to follow up with your provider, or enter a new e-Visit to address those concerns. You will get an e-mail with a link to a survey asking about your experience.  We hope that your e-visit has been valuable and will speed your recovery! Thank you  for using e-visits.    Approximately 5 minutes was spent documenting and reviewing patient's chart.

## 2020-02-08 ENCOUNTER — Telehealth: Payer: BC Managed Care – PPO | Admitting: Physician Assistant

## 2020-02-08 DIAGNOSIS — M109 Gout, unspecified: Secondary | ICD-10-CM

## 2020-02-08 MED ORDER — COLCHICINE 0.6 MG PO TABS: ORAL_TABLET | ORAL | 0 refills | Status: DC

## 2020-02-08 MED ORDER — PREDNISONE 20 MG PO TABS: 40.0000 mg | ORAL_TABLET | Freq: Every day | ORAL | 0 refills | Status: DC

## 2020-02-08 NOTE — Progress Notes (Signed)
E-Visit for Gout  We are sorry that you are not feeling well. We are here to help!  Based on what you shared with me it looks like you have a flare of your gout.  Gout is a form of arthritis. It can cause pain and swelling in the joints. At first, it tends to affect only 1 joint - most frequently the big toe. It happens in people who have too much uric acid in the blood. Uric acid is a chemical that is produced when the body breaks down certain foods. Uric acid can form sharp needle-like crystals that build up in the joints and cause pain. Uric acid crystals can also form inside the tubes that carry urine from the kidneys to the bladder. These crystals can turn into "kidney stones" that can cause pain and problems with the flow of urine. People with gout get sudden "flares" or attacks of severe pain, most often the big toe, ankle, or knee. Often the joint also turns red and swells. Usually, only 1 joint is affected, but some people have pain in more than 1 joint. Gout flares tend to happen more often during the night.  The pain from gout can be extreme. The pain and swelling are worst at the beginning of a gout flare. The symptoms then get better within a few days to weeks. It is not clear how the body "turns off" a gout flare.  Do not start any NEW preventative medicine until the gout has cleared completely. However, If you are already on Probenecid or Allopurinol for CHRONIC gout, you may continue taking this during an active flare up  I have prescribed Colchicine 0.6 mg tabs - Take 2 tabs immediately, then 1 tab twice per day for the duration of the flare up to a max of 7 days (but discontinue for stomach pains or diarrhea)  and I have prescribed Prednisone 40 mg daily for 7   HOME CARE Losing weight can help relieve gout. It's not clear that following a specific diet plan will help with gout symptoms but eating a balanced diet can help improve your overall health. It can also help you lose weight,  if you are overweight. In general, a healthy diet includes plenty of fruits, vegetables, whole grains, and low-fat dairy products (labelled "low fat", skim, 2%). Avoid sugar sweetened drinks (including sodas, tea, juice and juice blends, coffee drinks and sports drinks) Limit alcohol to 1-2 drinks of beer, spirits or wine daily these can make gout flares worse. Some people with gout also have other health problems, such as heart disease, high blood pressure, kidney disease, or obesity. If you have any of these issues, it's important to work with your doctor to manage them. This can help improve your overall health and might also help with your gout.  GET HELP RIGHT AWAY IF: . Your symptoms persist after you have completed your treatment plan . You develop severe diarrhea . You develop abnormal sensations .  You develop vomiting,  .  You develop weakness .  You develop abdominal pain  FOLLOW UP WITH YOUR PRIMARY PROVIDER IF: . If your symptoms do not improve within 10 days  MAKE SURE YOU   Understand these instructions.  Will watch your condition.  Will get help right away if you are not doing well or get worse.  Your e-visit answers were reviewed by a board certified advanced clinical practitioner to complete your personal care plan. Depending upon the condition, your plan could have  included both over the counter or prescription medications.  Your safety is important to [place]. If you have drug allergies check your prescription carefully.   You can use MyChart to ask questions about today's visit, request a non-urgent call back, or ask for a work or school excuse for 24 hours related to this e-Visit. If it has been greater than 24 hours you will need to follow up with your provider, or enter a new e-Visit to address those concerns. You will get an e-mail with a link to a survey asking about your experience.  We hope that your e-visit has been valuable and will speed your recovery! Thank you  for using e-visits.      Greater than 5 minutes, yet less than 10 minutes of time have been spent researching, coordinating, and implementing care for this patient today

## 2020-10-03 ENCOUNTER — Ambulatory Visit
Admission: RE | Admit: 2020-10-03 | Discharge: 2020-10-03 | Disposition: A | Discharge status: Home / Self Care | Payer: BC Managed Care – PPO | Source: Ambulatory Visit | Attending: Family Medicine | Admitting: Family Medicine

## 2020-10-03 ENCOUNTER — Other Ambulatory Visit: Payer: Self-pay

## 2020-10-03 VITALS — BP 149/99 | HR 69 | Temp 98.4°F | Resp 16 | Ht 70.0 in | Wt 190.0 lb

## 2020-10-03 DIAGNOSIS — H6122 Impacted cerumen, left ear: Secondary | ICD-10-CM

## 2020-10-03 NOTE — Discharge Instructions (Addendum)
OTC Debrox as needed. ° °Take care ° °Dr. Quiana Cobaugh  °

## 2020-10-03 NOTE — ED Provider Notes (Signed)
[facility]    CSN: [id number] Arrival date & time: 10/03/20  1723      History   Chief Complaint Chief Complaint  Patient presents with   Otalgia    HPI 50 year old male presents with the above complaint.  Patient reports fullness of the left ear and decreased hearing which started on Tuesday.  He reports some associated mild pain, 2/10 in severity.  He has used over-the-counter peroxide and attempts to remove wax without success.  No respiratory symptoms.  No other associated symptoms.  No other complaints.  Past Medical History:  Diagnosis Date   Allergies    Arthritis    BIL FEET   Collapse of right lung    YEARS AGO   GERD (gastroesophageal reflux disease)    RARE-NO MEDS   History of kidney stones    H/O    Patient Active Problem List   Diagnosis Date Noted   Umbilical hernia without obstruction and without gangrene    Gout 02/17/2016   Hearing loss 02/17/2016    Past Surgical History:  Procedure Laterality Date   NO PAST SURGERIES     UMBILICAL HERNIA REPAIR N/A 12/09/2017   Procedure: HERNIA REPAIR UMBILICAL ADULT;  Surgeon: Ancil Linsey, MD;  Location: [facility] ORS;  Service: Vascular;  Laterality: N/A;       Home Medications    Prior to Admission medications   Medication Sig Start Date End Date Taking? Authorizing Provider  amLODipine (NORVASC) 5 MG tablet Take by mouth.   Yes [provider]  fluticasone (FLONASE) 50 MCG/ACT nasal spray Place 1 spray into both nostrils daily.  11/28/17  Yes [provider]  colchicine 0.6 MG tablet Take 2 tabs immediately, then 1 tab twice per day for the duration of the flare up to a max of 7 days 02/08/20   Muthersbaugh, Dahlia Client, PA-C  Melatonin 5 MG TABS Take by mouth at bedtime.    [provider]  naproxen (NAPROSYN) 500 MG tablet Take 1 tablet (500 mg total) by mouth 2 (two) times daily with a meal. 11/16/19   Tommie Sams, DO  predniSONE (DELTASONE) 20 MG tablet Take 2  tablets (40 mg total) by mouth daily with breakfast. 02/08/20   Muthersbaugh, Dahlia Client, PA-C    Family History Family History  Problem Relation Age of Onset   Skin cancer Mother    Cancer Sister    Cancer Paternal Grandfather    Prostate cancer Neg Hx    Bladder Cancer Neg Hx    Kidney cancer Neg Hx     Social History Social History   Tobacco Use   Smoking status: Never   Smokeless tobacco: Never  Vaping Use   Vaping Use: Never used  Substance Use Topics   Alcohol use: Yes    Alcohol/week: 8.0 standard drinks    Types: 8 Shots of liquor per week    Comment: OCC   Drug use: No     Allergies   Patient has no known allergies.   Review of Systems Review of Systems  Constitutional: Negative.   HENT:  Positive for ear pain and hearing loss.     Physical Exam Triage Vital Signs ED Triage Vitals  Enc Vitals Group     BP 10/03/20 1738 (!) 149/99     Pulse Rate 10/03/20 1738 69     Resp 10/03/20 1738 16     Temp 10/03/20 1738 98.4 F (36.9 C)     Temp Source 10/03/20  1738 Oral     SpO2 10/03/20 1738 98 %     Weight 10/03/20 1735 190 lb (86.2 kg)     Height 10/03/20 1735 5\' 10"  (1.778 m)     Head Circumference --      Peak Flow --      Pain Score 10/03/20 1735 2     Pain Loc --      Pain Edu? --      Excl. in GC? --    Updated Vital Signs BP (!) 149/99 (BP Location: Left Arm)   Pulse 69   Temp 98.4 F (36.9 C) (Oral)   Resp 16   Ht 5\' 10"  (1.778 m)   Wt 86.2 kg   SpO2 98%   BMI 27.26 kg/m   Visual Acuity Right Eye Distance:   Left Eye Distance:   Bilateral Distance:    Right Eye Near:   Left Eye Near:    Bilateral Near:     Physical Exam Vitals and nursing note reviewed.  Constitutional:      General: He is not in acute distress.    Appearance: Normal appearance. He is not ill-appearing.  HENT:     Head: Normocephalic and atraumatic.     Ears:     Comments: Left ear - cerumen impaction.  Eyes:     General:        Right eye: No discharge.         Left eye: No discharge.     Conjunctiva/sclera: Conjunctivae normal.  Pulmonary:     Effort: Pulmonary effort is normal. No respiratory distress.  Neurological:     Mental Status: He is alert.  Psychiatric:        Mood and Affect: Mood normal.        Behavior: Behavior normal.     UC Treatments / Results  Labs (all labs ordered are listed, but only abnormal results are displayed) Labs Reviewed - No data to display  EKG   Radiology No results found.  Procedures Procedures (including critical care time)  Medications Ordered in UC Medications - No data to display  Initial Impression / Assessment and Plan / UC Course  I have reviewed the triage vital signs and the nursing notes.  Pertinent labs & imaging results that were available during my care of the patient were reviewed by me and considered in my medical decision making (see chart for details).    50 year old male presents with cerumen impaction.  Successful lavage today.  Advised over-the-counter Debrox.  Supportive care.  Final Clinical Impressions(s) / UC Diagnoses   Final diagnoses:  Impacted cerumen of left ear     Discharge Instructions      OTC Debrox as needed.  Take care  Dr.    ED Prescriptions   None    PDMP not reviewed this encounter.   44, Adriana Simas 10/03/20 1826

## 2020-10-03 NOTE — ED Triage Notes (Signed)
Patient reports fullness in his left ear that started on Tuesday.  Patient c/o pain in his left ear that started today.

## 2020-12-25 ENCOUNTER — Emergency Department: Payer: BC Managed Care – PPO

## 2020-12-25 ENCOUNTER — Other Ambulatory Visit: Payer: Self-pay

## 2020-12-25 ENCOUNTER — Encounter: Payer: Self-pay | Admitting: *Deleted

## 2020-12-25 DIAGNOSIS — R000 Tachycardia, unspecified: Secondary | ICD-10-CM | POA: Insufficient documentation

## 2020-12-25 DIAGNOSIS — I10 Essential (primary) hypertension: Secondary | ICD-10-CM | POA: Insufficient documentation

## 2020-12-25 DIAGNOSIS — N50812 Left testicular pain: Secondary | ICD-10-CM

## 2020-12-25 DIAGNOSIS — N433 Hydrocele, unspecified: Secondary | ICD-10-CM | POA: Diagnosis not present

## 2020-12-25 DIAGNOSIS — N492 Inflammatory disorders of scrotum: Secondary | ICD-10-CM | POA: Diagnosis present

## 2020-12-25 DIAGNOSIS — N5089 Other specified disorders of the male genital organs: Secondary | ICD-10-CM

## 2020-12-25 LAB — URINALYSIS, ROUTINE W REFLEX MICROSCOPIC
Bilirubin Urine: NEGATIVE
Glucose, UA: NEGATIVE mg/dL
Hgb urine dipstick: NEGATIVE
Ketones, ur: 20 mg/dL — AB
Leukocytes,Ua: NEGATIVE
Nitrite: NEGATIVE
Protein, ur: NEGATIVE mg/dL
Specific Gravity, Urine: 1.028 (ref 1.005–1.030)
pH: 5 (ref 5.0–8.0)

## 2020-12-25 NOTE — ED Triage Notes (Signed)
Pt has left testicular swelling for 1 week.  Pain increased today.  No diff urinating.  No penile discharge.  Pt alert

## 2020-12-26 ENCOUNTER — Emergency Department
Admission: EM | Admit: 2020-12-26 | Discharge: 2020-12-26 | Disposition: A | Discharge status: Home / Self Care | Payer: BC Managed Care – PPO | Attending: Emergency Medicine | Admitting: Emergency Medicine

## 2020-12-26 DIAGNOSIS — I10 Essential (primary) hypertension: Secondary | ICD-10-CM

## 2020-12-26 DIAGNOSIS — N5089 Other specified disorders of the male genital organs: Secondary | ICD-10-CM

## 2020-12-26 DIAGNOSIS — N433 Hydrocele, unspecified: Secondary | ICD-10-CM

## 2020-12-26 DIAGNOSIS — N50812 Left testicular pain: Secondary | ICD-10-CM

## 2020-12-26 LAB — CHLAMYDIA/NGC RT PCR (ARMC ONLY)
Chlamydia Tr: NOT DETECTED
N gonorrhoeae: NOT DETECTED

## 2020-12-26 MED ORDER — OXYCODONE-ACETAMINOPHEN 5-325 MG PO TABS
1.0000 | ORAL_TABLET | Freq: Once | ORAL | Status: AC
  Administered 2020-12-26: 1 via ORAL
  Filled 2020-12-26: qty 1

## 2020-12-26 MED ORDER — IBUPROFEN 400 MG PO TABS
400.0000 mg | ORAL_TABLET | Freq: Once | ORAL | Status: AC
  Administered 2020-12-26: 400 mg via ORAL
  Filled 2020-12-26: qty 1

## 2020-12-26 NOTE — Discharge Instructions (Addendum)
Your blood pressure was quite elevated today.  Please start taking your amlodipine every day and follow-up with your PCP to have this rechecked and see if you need to have your medications adjusted.

## 2020-12-26 NOTE — ED Provider Notes (Signed)
[facility] Emergency Department Provider Note  ____________________________________________   Event Date/Time   First MD Initiated Contact with Patient 12/26/20 [phone]     (approximate)  I have reviewed the triage vital signs and the nursing notes.   HISTORY  Chief Complaint Testicle Pain   HPI Tracy Villarreal is a 50 y.o. male with a past medical history of remote kidney stones, arthritis and umbilical hernia as well as some gout who presents for assessment of approximately 1 week of some swelling and discomfort in the left testicle rating towards left groin.  Patient denies any trauma or injuries or right testicular pain.  He denies any rashes or burning with urination, blood in his urine or any penile discharge.  He denies any abdominal pain, back pain, chest pain, cough, shortness of breath, headache, earache, sore throat, fevers, recent injuries or falls or any other acute sick symptoms.  No prior similar episodes.  No other acute concerns at this time.  No analgesia prior to arrival.  He states he has not been taking his blood pressure medicines as directed regularly.         Past Medical History:  Diagnosis Date   Allergies    Arthritis    BIL FEET   Collapse of right lung    YEARS AGO   GERD (gastroesophageal reflux disease)    RARE-NO MEDS   History of kidney stones    H/O    Patient Active Problem List   Diagnosis Date Noted   Umbilical hernia without obstruction and without gangrene    Gout 02/17/2016   Hearing loss 02/17/2016    Past Surgical History:  Procedure Laterality Date   NO PAST SURGERIES     UMBILICAL HERNIA REPAIR N/A 12/09/2017   Procedure: HERNIA REPAIR UMBILICAL ADULT;  Surgeon: Ancil Linsey, MD;  Location: [facility] ORS;  Service: Vascular;  Laterality: N/A;    Prior to Admission medications   Medication Sig Start Date End Date Taking? Authorizing Provider  amLODipine (NORVASC) 5 MG tablet Take by mouth.     [provider]  colchicine 0.6 MG tablet Take 2 tabs immediately, then 1 tab twice per day for the duration of the flare up to a max of 7 days 02/08/20   Muthersbaugh, Dahlia Client, PA-C  fluticasone (FLONASE) 50 MCG/ACT nasal spray Place 1 spray into both nostrils daily.  11/28/17   [provider]  Melatonin 5 MG TABS Take by mouth at bedtime.    [provider]  naproxen (NAPROSYN) 500 MG tablet Take 1 tablet (500 mg total) by mouth 2 (two) times daily with a meal. 11/16/19   Tommie Sams, DO  predniSONE (DELTASONE) 20 MG tablet Take 2 tablets (40 mg total) by mouth daily with breakfast. 02/08/20   Muthersbaugh, Dahlia Client, PA-C    Allergies Patient has no known allergies.  Family History  Problem Relation Age of Onset   Skin cancer Mother    Cancer Sister    Cancer Paternal Grandfather    Prostate cancer Neg Hx    Bladder Cancer Neg Hx    Kidney cancer Neg Hx     Social History Social History   Tobacco Use   Smoking status: Never   Smokeless tobacco: Never  Vaping Use   Vaping Use: Never used  Substance Use Topics   Alcohol use: Yes    Alcohol/week: 8.0 standard drinks    Types: 8 Shots of liquor per week    Comment: OCC  Drug use: No    Review of Systems  Review of Systems  Constitutional:  Negative for chills and fever.  HENT:  Negative for sore throat.   Eyes:  Negative for pain.  Respiratory:  Negative for cough and stridor.   Cardiovascular:  Negative for chest pain.  Gastrointestinal:  Negative for vomiting.  Musculoskeletal:  Negative for myalgias.  Skin:  Negative for rash.  Neurological:  Negative for seizures, loss of consciousness and headaches.  Psychiatric/Behavioral:  Negative for suicidal ideas.   All other systems reviewed and are negative.    ____________________________________________   PHYSICAL EXAM:  VITAL SIGNS: ED Triage Vitals [12/25/20 2047]  Enc Vitals Group     BP (!) 181/158     Pulse Rate (!) 120     Resp  20     Temp 98.5 F (36.9 C)     Temp Source Oral     SpO2 98 %     Weight 200 lb (90.7 kg)     Height 5\' 10"  (1.778 m)     Head Circumference      Peak Flow      Pain Score 9     Pain Loc      Pain Edu?      Excl. in GC?    Vitals:   12/25/20 2047 12/26/20 0454  BP: (!) 181/158 (!) 169/96  Pulse: (!) 120 95  Resp: 20 16  Temp: 98.5 F (36.9 C)   SpO2: 98% 98%   Physical Exam Vitals and nursing note reviewed.  Constitutional:      Appearance: He is well-developed.  HENT:     Head: Normocephalic and atraumatic.     Right Ear: External ear normal.     Left Ear: External ear normal.     Nose: Nose normal.  Eyes:     Conjunctiva/sclera: Conjunctivae normal.  Cardiovascular:     Rate and Rhythm: Normal rate and regular rhythm.     Heart sounds: No murmur heard. Pulmonary:     Effort: Pulmonary effort is normal. No respiratory distress.     Breath sounds: Normal breath sounds.  Abdominal:     Palpations: Abdomen is soft.     Tenderness: There is no abdominal tenderness.  Musculoskeletal:     Cervical back: Neck supple.  Skin:    General: Skin is warm and dry.     Capillary Refill: Capillary refill takes less than 2 seconds.  Neurological:     Mental Status: He is alert and oriented to person, place, and time.  Psychiatric:        Mood and Affect: Mood normal.    Testicular and scrotal exam shows some apparent fluid in the scrotum without induration, focal tenderness or any rashes or lesions.  No penile discharge noted.  Lower abdomen is unremarkable and there are no palpable inguinal hernias. ____________________________________________   LABS (all labs ordered are listed, but only abnormal results are displayed)  Labs Reviewed  URINALYSIS, ROUTINE W REFLEX MICROSCOPIC - Abnormal; Notable for the following components:      Result Value   Color, Urine YELLOW (*)    APPearance CLEAR (*)    Ketones, ur 20 (*)    All other components within normal limits   CHLAMYDIA/NGC RT PCR ([facility] ONLY)             ____________________________________________  EKG  ____________________________________________  RADIOLOGY  ED MD interpretation: Testicular scrotal ultrasound with Doppler shows no evidence of torsion or epididymitis  potential bilateral hydroceles larger on the left than the right.  Official radiology report(s): US SCROTUM W/DOPPLER  Result Date: 12/25/2020 CLINICAL DATA:  Left testicular pain, swelling EXAM: SCROTAL ULTRASOUND DOPPLER ULTRASOUND OF THE TESTICLES TECHNIQUE: Complete ultrasound examination of the testicles, epididymis, and other scrotal structures was performed. Color and spectral Doppler ultrasound were also utilized to evaluate blood flow to the testicles. COMPARISON:  None. FINDINGS: Right testicle Measurements: 4.5 x 2.3 x 3.0 cm. No mass or microlithiasis visualized. Left testicle Measurements: 4.1 x 2.8 x 2.9 cm. No mass or microlithiasis visualized. Right epididymis:  Normal in size and appearance. Left epididymis:  Normal in size and appearance. Hydrocele:  Small right hydrocele, large left hydrocele. Varicocele:  None visualized. Pulsed Doppler interrogation of both testes demonstrates normal low resistance arterial and venous waveforms bilaterally. IMPRESSION: No testicular abnormality or evidence of torsion. Bilateral hydroceles, left larger than right Electronically Signed   By: Charlett Nose M.D.   On: 12/25/2020 21:37    ____________________________________________   PROCEDURES  Procedure(s) performed (including Critical Care):  Procedures   ____________________________________________   INITIAL IMPRESSION / ASSESSMENT AND PLAN / ED COURSE      Patient presents with above-stated history exam for assessment approximately 1 week of nontraumatic left-sided testicular pain and swelling.  No associated rashes skin lesions penile discharge or dysuria.  On arrival he is slight tachycardic at 120 and hypertensive  with BP of 181/158 with otherwise stable vital signs on room air.  On reassessment though his heart rate is 95 and his BP is 169/96.  On exam he does have some swelling in the left testicle without significant tenderness or overlying skin changes and there are no rashes or lesions.  Differential includes testicular torsion, epididymitis, urethritis, testicular mass and symptomatic varicocele or hydrocele exam of unable to palpate any hernias on exam.  No findings to suggest cellulitis at this time.  GC studies are negative.  UA has some ketones but no evidence of infection or blood.  Testicular scrotal ultrasound with Doppler shows no evidence of torsion or epididymitis potential bilateral hydroceles larger on the left than the right.  I suspect likely symptomatic hydrocele.  Patient given some analgesia and instructed to follow-up with urology.  Discharged in stable condition.  Strict return precautions advised and discussed.  Also discussed patient's elevated blood pressure today which I think is unrelated and asymptomatic at this time and importance of taking his blood pressure medicines and following up with his PCP to have his blood pressure rechecked.  He is amenable with plan.  Discharged in stable condition.       ____________________________________________   FINAL CLINICAL IMPRESSION(S) / ED DIAGNOSES  Final diagnoses:  Hydrocele in adult  Pain in left testicle  Hypertension, unspecified type    Medications  oxyCODONE-acetaminophen (PERCOCET/ROXICET) 5-325 MG per tablet 1 tablet (1 tablet Oral Given 12/26/20 0457)  ibuprofen (ADVIL) tablet 400 mg (400 mg Oral Given 12/26/20 0457)     ED Discharge Orders     None        Note:  This document was prepared using Dragon voice recognition software and may include unintentional dictation errors.    Gilles Chiquito, MD 12/26/20 [phone]

## 2021-01-02 ENCOUNTER — Other Ambulatory Visit: Payer: Self-pay

## 2021-01-02 ENCOUNTER — Ambulatory Visit (INDEPENDENT_AMBULATORY_CARE_PROVIDER_SITE_OTHER): Payer: BC Managed Care – PPO | Admitting: Urology

## 2021-01-02 ENCOUNTER — Encounter: Payer: Self-pay | Admitting: Urology

## 2021-01-02 VITALS — BP 150/98 | HR 78 | Ht 70.0 in | Wt 200.0 lb

## 2021-01-02 DIAGNOSIS — N5082 Scrotal pain: Secondary | ICD-10-CM

## 2021-01-02 DIAGNOSIS — R1032 Left lower quadrant pain: Secondary | ICD-10-CM

## 2021-01-02 DIAGNOSIS — N433 Hydrocele, unspecified: Secondary | ICD-10-CM

## 2021-01-02 DIAGNOSIS — N5089 Other specified disorders of the male genital organs: Secondary | ICD-10-CM

## 2021-01-02 MED ORDER — HYDROCODONE-ACETAMINOPHEN 5-325 MG PO TABS: 1.0000 | ORAL_TABLET | ORAL | 0 refills | Status: DC | PRN

## 2021-01-02 NOTE — Progress Notes (Signed)
01/02/2021 9:57 AM   Tracy Villarreal 1970/09/18 [id number]  Referring provider: Blair Heys, MD [address] [place],  [place] [address]  Chief Complaint  Patient presents with   Hydrocele    HPI: Tracy Villarreal is a 50 y.o. male who presents for evaluation of a left hydrocele.  Presented to ED 12/26/2020 with a 1 week history of left hemiscrotal swelling and pain radiating to left groin region  No bothersome LUTS  Scrotal sonogram performed which showed normal-appearing testes/epididymes bilaterally and a large left hydrocele Since ED visit he has noted persistent pain.  He has a constant dull pain in the left hemiscrotum with intermittent sharp, stabbing pain that radiates into the left lower quadrant which is rated at 7-8/10 He attempted intercourse earlier this week and had retraction of his right testis into the right groin region He has been taking ibuprofen 800 mg Gives a history of a prior large hydrocele which resolved spontaneously   PMH: Past Medical History:  Diagnosis Date   Allergies    Arthritis    BIL FEET   Collapse of right lung    YEARS AGO   GERD (gastroesophageal reflux disease)    RARE-NO MEDS   History of kidney stones    H/O    Surgical History: Past Surgical History:  Procedure Laterality Date   NO PAST SURGERIES     UMBILICAL HERNIA REPAIR N/A 12/09/2017   Procedure: HERNIA REPAIR UMBILICAL ADULT;  Surgeon: Ancil Linsey, MD;  Location: [facility] ORS;  Service: Vascular;  Laterality: N/A;    Home Medications:  Allergies as of 01/02/2021   No Known Allergies      Medication List        Accurate as of January 02, 2021  9:57 AM. If you have any questions, ask your nurse or doctor.          amLODipine 5 MG tablet Commonly known as: NORVASC Take by mouth.   colchicine 0.6 MG tablet Take 2 tabs immediately, then 1 tab twice per day for the duration of the flare up to a max of 7 days   fluticasone 50  MCG/ACT nasal spray Commonly known as: FLONASE Place 1 spray into both nostrils daily.   melatonin 5 MG Tabs Take by mouth at bedtime.   naproxen 500 MG tablet Commonly known as: NAPROSYN Take 1 tablet (500 mg total) by mouth 2 (two) times daily with a meal.   predniSONE 20 MG tablet Commonly known as: DELTASONE Take 2 tablets (40 mg total) by mouth daily with breakfast.        Allergies: No Known Allergies  Family History: Family History  Problem Relation Age of Onset   Skin cancer Mother    Cancer Sister    Cancer Paternal Grandfather    Prostate cancer Neg Hx    Bladder Cancer Neg Hx    Kidney cancer Neg Hx     Social History:  reports that he has never smoked. He has never used smokeless tobacco. He reports current alcohol use of about 8.0 standard drinks per week. He reports that he does not use drugs.   Physical Exam: BP (!) 150/98   Pulse 78   Ht 5\' 10"  (1.778 m)   Wt 200 lb (90.7 kg)   BMI 28.70 kg/m   Constitutional:  Alert and oriented, No acute distress. HEENT: [place] AT, moist mucus membranes.  Trachea midline, no masses. Cardiovascular: No clubbing, cyanosis, or edema. Respiratory: Normal respiratory  effort, no increased work of breathing. GI: Abdomen is soft, nontender, nondistended, no abdominal masses GU: Phallus without lesions.  Scrotal skin without thickening or erythema.  I do not appreciate a large left hydrocele on today's exam.  There is a firm, spherical mass superior to the left testis.  No definite evidence of hernia. Skin: No rashes, bruises or suspicious lesions. Psychiatric: Normal mood and affect.   Pertinent Imaging: Ultrasound images were personally reviewed and and interpreted  Assessment & Plan:    1.  Left scrotal/left abdominal pain No significant hydrocele appreciated on today's exam Recommend CT abdomen/pelvis for further evaluation Limited Rx hydrocodone/APAP until CT performed   Riki Altes, MD  [facility]  [facility] [address] [place], [place] [address] [phone]

## 2021-01-05 ENCOUNTER — Ambulatory Visit
Admission: RE | Admit: 2021-01-05 | Discharge: 2021-01-05 | Disposition: A | Discharge status: Home / Self Care | Payer: BC Managed Care – PPO | Source: Ambulatory Visit | Attending: Urology | Admitting: Urology

## 2021-01-05 ENCOUNTER — Other Ambulatory Visit: Payer: Self-pay

## 2021-01-05 DIAGNOSIS — N5082 Scrotal pain: Secondary | ICD-10-CM | POA: Diagnosis present

## 2021-01-05 DIAGNOSIS — R1032 Left lower quadrant pain: Secondary | ICD-10-CM | POA: Diagnosis not present

## 2021-01-05 DIAGNOSIS — N5089 Other specified disorders of the male genital organs: Secondary | ICD-10-CM | POA: Insufficient documentation

## 2021-01-06 ENCOUNTER — Encounter: Payer: Self-pay | Admitting: Urology

## 2021-01-06 ENCOUNTER — Other Ambulatory Visit: Payer: Self-pay | Admitting: Urology

## 2021-01-07 ENCOUNTER — Encounter: Payer: Self-pay | Admitting: *Deleted

## 2021-01-07 ENCOUNTER — Ambulatory Visit: Payer: Self-pay | Admitting: Urology

## 2021-01-07 MED ORDER — HYDROCODONE-ACETAMINOPHEN 5-325 MG PO TABS: 1.0000 | ORAL_TABLET | ORAL | 0 refills | Status: DC | PRN

## 2021-01-09 ENCOUNTER — Encounter: Payer: Self-pay | Admitting: Urology

## 2021-01-09 ENCOUNTER — Ambulatory Visit: Payer: BC Managed Care – PPO | Admitting: Urology

## 2021-01-09 ENCOUNTER — Other Ambulatory Visit: Payer: Self-pay

## 2021-01-09 VITALS — BP 158/103 | HR 76 | Ht 70.0 in | Wt 200.0 lb

## 2021-01-09 DIAGNOSIS — R1032 Left lower quadrant pain: Secondary | ICD-10-CM | POA: Diagnosis not present

## 2021-01-09 DIAGNOSIS — N5082 Scrotal pain: Secondary | ICD-10-CM | POA: Diagnosis not present

## 2021-01-09 NOTE — Progress Notes (Signed)
01/09/2021 10:04 AM   Tracy Villarreal 1971/01/14 [id number]  Referring provider: Blair Heys, MD [address] [place],  [place] [address]  Chief Complaint  Patient presents with   Other    HPI: 50 y.o. male presents for follow-up of left groin/scrotal pain.  Initially seen 01/02/2021 Scrotal sonogram remarkable for large hydrocele however on exam the hydrocele was small to moderate.  A supratesticular mass was appreciated and CT recommended CT performed 01/05/2021 showed a punctate nonobstructing right renal calculus and no evidence of hernia Diagnostic cord block was recommended   PMH: Past Medical History:  Diagnosis Date   Allergies    Arthritis    BIL FEET   Collapse of right lung    YEARS AGO   GERD (gastroesophageal reflux disease)    RARE-NO MEDS   History of kidney stones    H/O    Surgical History: Past Surgical History:  Procedure Laterality Date   NO PAST SURGERIES     UMBILICAL HERNIA REPAIR N/A 12/09/2017   Procedure: HERNIA REPAIR UMBILICAL ADULT;  Surgeon: Ancil Linsey, MD;  Location: [facility] ORS;  Service: Vascular;  Laterality: N/A;    Home Medications:  Allergies as of 01/09/2021   No Known Allergies      Medication List        Accurate as of January 09, 2021 10:04 AM. If you have any questions, ask your nurse or doctor.          amLODipine 5 MG tablet Commonly known as: NORVASC Take by mouth.   colchicine 0.6 MG tablet Take 2 tabs immediately, then 1 tab twice per day for the duration of the flare up to a max of 7 days   fluticasone 50 MCG/ACT nasal spray Commonly known as: FLONASE Place 1 spray into both nostrils daily.   HYDROcodone-acetaminophen 5-325 MG tablet Commonly known as: NORCO/VICODIN Take 1 tablet by mouth every 4 (four) hours as needed for moderate pain.   melatonin 5 MG Tabs Take by mouth at bedtime.   naproxen 500 MG tablet Commonly known as: NAPROSYN Take 1 tablet (500 mg  total) by mouth 2 (two) times daily with a meal.   predniSONE 20 MG tablet Commonly known as: DELTASONE Take 2 tablets (40 mg total) by mouth daily with breakfast.        Allergies: No Known Allergies  Family History: Family History  Problem Relation Age of Onset   Skin cancer Mother    Cancer Sister    Cancer Paternal Grandfather    Prostate cancer Neg Hx    Bladder Cancer Neg Hx    Kidney cancer Neg Hx     Social History:  reports that he has never smoked. He has never used smokeless tobacco. He reports current alcohol use of about 8.0 standard drinks per week. He reports that he does not use drugs.   Physical Exam: BP (!) 158/103   Pulse 76   Ht 5\' 10"  (1.778 m)   Wt 200 lb (90.7 kg)   BMI 28.70 kg/m   Constitutional:  Alert and oriented, No acute distress. HEENT: [place] AT, moist mucus membranes.  Trachea midline, no masses. Cardiovascular: No clubbing, cyanosis, or edema. Respiratory: Normal respiratory effort, no increased work of breathing. GI: Abdomen is soft, nontender, nondistended, no abdominal masses GU: Moderate right hydrocele.  Supratesticular mass not appreciated on today's exam Skin: No rashes, bruises or suspicious lesions. Neurologic: Grossly intact, no focal deficits, moving all 4 extremities. Psychiatric:  Normal mood and affect.   Pertinent Imaging: CT images were personally reviewed and interpreted  CT RENAL STONE STUDY  Narrative CLINICAL DATA:  Through a history of left lower quadrant pain. Scrotal mass.  EXAM: CT ABDOMEN AND PELVIS WITHOUT CONTRAST  TECHNIQUE: Multidetector CT imaging of the abdomen and pelvis was performed following the standard protocol without IV contrast.  COMPARISON:  02/14/2016  FINDINGS: Lower chest: Unremarkable.  Hepatobiliary: The liver shows diffusely decreased attenuation suggesting fat deposition. Small hypodensities identified in the liver are too small to characterize but likely benign. Most of  these were present on the prior study. There is no evidence for gallstones, gallbladder wall thickening, or pericholecystic fluid. No intrahepatic or extrahepatic biliary dilation.  Pancreas: No focal mass lesion. No dilatation of the main duct. No intraparenchymal cyst. No peripancreatic edema.  Spleen: No splenomegaly. No focal mass lesion.  Adrenals/Urinary Tract: No adrenal nodule or mass. Punctate nonobstructing stone identified interpolar right kidney (coronal image 97/series 5). No left renal stones. No ureteral or bladder stones. No secondary changes in either kidney or ureter.  Stomach/Bowel: Stomach is unremarkable. No gastric wall thickening. No evidence of outlet obstruction. Duodenum is normally positioned as is the ligament of Treitz. No small bowel wall thickening. No small bowel dilatation. The terminal ileum is normal. The appendix is normal. No gross colonic mass. No colonic wall thickening.  Vascular/Lymphatic: There is mild atherosclerotic calcification of the abdominal aorta without aneurysm. There is no gastrohepatic or hepatoduodenal ligament lymphadenopathy. No retroperitoneal or mesenteric lymphadenopathy. No pelvic sidewall lymphadenopathy.  Reproductive: The prostate gland and seminal vesicles are unremarkable.  Other: No intraperitoneal free fluid.  Musculoskeletal: No worrisome lytic or sclerotic osseous abnormality.  IMPRESSION: 1. No acute findings in the abdomen or pelvis. Specifically, no findings to explain the patient's history of left lower quadrant pain. 2. Punctate nonobstructing right renal stone. 3. Hepatic steatosis. 4. Aortic Atherosclerosis (ICD10-I70.0).   Electronically Signed By: Kennith Center M.D. On: 01/06/2021 08:30  Cord block:  The left spermatic cord was grasped at the pubic tubercle and elevated.  Site was prepped with an alcohol swab and 10 cc of 0.5% bupivacaine was injected throughout the spermatic  cord   Assessment & Plan:    1.  Left groin/scrotal pain Etiology undetermined If he gets no pain resolution, even temporary resolution with the cord block we will ask general surgery to see to evaluate for a possible sports hernia   Riki Altes, MD  [facility] [address], [address] [place], [place] [address] [phone]

## 2021-01-12 ENCOUNTER — Encounter: Payer: Self-pay | Admitting: Urology

## 2021-01-13 ENCOUNTER — Encounter: Payer: Self-pay | Admitting: Urology

## 2021-01-13 ENCOUNTER — Other Ambulatory Visit: Payer: Self-pay | Admitting: Urology

## 2021-01-13 DIAGNOSIS — R1032 Left lower quadrant pain: Secondary | ICD-10-CM

## 2021-01-22 ENCOUNTER — Ambulatory Visit
Admission: RE | Admit: 2021-01-22 | Discharge: 2021-01-22 | Disposition: A | Discharge status: Home / Self Care | Payer: BC Managed Care – PPO | Source: Ambulatory Visit | Attending: Urology | Admitting: Urology

## 2021-01-22 ENCOUNTER — Encounter: Payer: Self-pay | Admitting: Urology

## 2021-01-22 ENCOUNTER — Other Ambulatory Visit: Payer: Self-pay

## 2021-01-22 ENCOUNTER — Other Ambulatory Visit: Payer: Self-pay | Admitting: Urology

## 2021-01-22 DIAGNOSIS — R1032 Left lower quadrant pain: Secondary | ICD-10-CM

## 2021-01-22 DIAGNOSIS — N5082 Scrotal pain: Secondary | ICD-10-CM | POA: Diagnosis present

## 2021-01-23 ENCOUNTER — Telehealth: Payer: Self-pay | Admitting: Urology

## 2021-01-23 NOTE — Telephone Encounter (Signed)
I contacted Tracy Villarreal earlier this morning regarding his repeat scrotal ultrasound.  He saw Dr. Lemar Livings earlier this week and there was no evidence of hernia and it was no indication for surgical management was identified.  He had mentioned the scrotum had a bluish tent on his exam and I had recommended a repeat scrotal ultrasound to compare with his previous.  This was performed yesterday and there is no significant change from the prior scrotal ultrasound.  He has a large left hydrocele.  The testis has normal echotexture and no evidence of increased flow to suggest inflammation in the testis or epididymis.  The hydrocele fluid is anechoic and not indicative of infection.  Based on the severity of his pain we again discussed this would be an atypical presentation of a hydrocele and my concern is that hydrocelectomy would not resolve his pain and could potentially worsen.  I did recommend hydrocele aspiration to see if this provides him any relief and if so hydrocelectomy could be entertained.  I offered to perform this in the office today or next week if he desired.  He wanted to discuss with his wife and indicated he would call back if he desires to pursue aspiration.

## 2021-01-26 ENCOUNTER — Ambulatory Visit: Payer: BC Managed Care – PPO | Admitting: Surgery

## 2021-01-28 ENCOUNTER — Ambulatory Visit (INDEPENDENT_AMBULATORY_CARE_PROVIDER_SITE_OTHER): Payer: BC Managed Care – PPO | Admitting: Urology

## 2021-01-28 ENCOUNTER — Encounter: Payer: Self-pay | Admitting: Urology

## 2021-01-28 ENCOUNTER — Other Ambulatory Visit: Payer: Self-pay

## 2021-01-28 VITALS — BP 149/93 | HR 102 | Ht 70.0 in | Wt 200.0 lb

## 2021-01-28 DIAGNOSIS — R1032 Left lower quadrant pain: Secondary | ICD-10-CM

## 2021-01-28 NOTE — Progress Notes (Signed)
01/28/2021 3:47 PM   Tracy Villarreal November 06, 1970 [id number]  Referring provider: Blair Heys, MD [address] [place],  [place] [address]  Chief Complaint  Patient presents with   Hydrocele    HPI: 50 y.o. male with moderate to severe left groin/scrotal pain which initially started mid October 2022.  Scrotal sonogram remarkable for a left hydrocele but no testicular abnormalities or evidence of hernia No improvement in symptoms on NSAIDs CT abdomen pelvis showed no evidence of hernia or GU abnormalities Diagnostic cord block was performed which did not provide any pain relief He saw Dr. Lemar Livings last week and a hernia was not identified I ordered a repeat scrotal sonogram which had not significantly changed and he was offered a hydrocele aspiration to see if this made any difference in his pain He was scheduled for hydrocele aspiration today however states his pain has improved and he does not feel the hydrocele is as large as prior.  Does note intermittent increase and decrease in left hemiscrotal size Scrotal ultrasound was performed which shows only a small to moderate amount of hydrocele fluid and aspiration was not recommended as I did not feel it would significantly impact his pain Currently he states the majority of his pain is in the groin/inguinal region   PMH: Past Medical History:  Diagnosis Date   Allergies    Arthritis    BIL FEET   Collapse of right lung    YEARS AGO   GERD (gastroesophageal reflux disease)    RARE-NO MEDS   History of kidney stones    H/O    Surgical History: Past Surgical History:  Procedure Laterality Date   NO PAST SURGERIES     UMBILICAL HERNIA REPAIR N/A 12/09/2017   Procedure: HERNIA REPAIR UMBILICAL ADULT;  Surgeon: Ancil Linsey, MD;  Location: [facility] ORS;  Service: Vascular;  Laterality: N/A;    Home Medications:  Allergies as of 01/28/2021   No Known Allergies      Medication List         Accurate as of January 28, 2021  3:47 PM. If you have any questions, ask your nurse or doctor.          STOP taking these medications    colchicine 0.6 MG tablet Stopped by: Riki Altes, MD   HYDROcodone-acetaminophen 5-325 MG tablet Commonly known as: NORCO/VICODIN Stopped by: Riki Altes, MD   melatonin 5 MG Tabs Stopped by: Riki Altes, MD   naproxen 500 MG tablet Commonly known as: NAPROSYN Stopped by: Riki Altes, MD   predniSONE 20 MG tablet Commonly known as: DELTASONE Stopped by: Riki Altes, MD       TAKE these medications    amLODipine 5 MG tablet Commonly known as: NORVASC Take by mouth.   fluticasone 50 MCG/ACT nasal spray Commonly known as: FLONASE Place 1 spray into both nostrils daily.        Allergies: No Known Allergies  Family History: Family History  Problem Relation Age of Onset   Skin cancer Mother    Cancer Sister    Cancer Paternal Grandfather    Prostate cancer Neg Hx    Bladder Cancer Neg Hx    Kidney cancer Neg Hx     Social History:  reports that he has never smoked. He has never used smokeless tobacco. He reports current alcohol use of about 8.0 standard drinks per week. He reports that he does not use drugs.  Physical Exam: BP (!) 149/93   Pulse (!) 102   Ht 5\' 10"  (1.778 m)   Wt 200 lb (90.7 kg)   BMI 28.70 kg/m   Constitutional:  Alert and oriented, No acute distress. HEENT: [place] AT, moist mucus membranes.  Trachea midline, no masses. Cardiovascular: No clubbing, cyanosis, or edema. Respiratory: Normal respiratory effort, no increased work of breathing. GI: Abdomen is soft, nontender, nondistended, no abdominal masses GU: Small to moderate left hydrocele   Assessment & Plan:    1.  Left groin/scrotal pain No definite abnormalities other than a small to moderate left hydrocele Pain is atypical for hydrocele presentation Presently the majority of his pain is in the left inguinal/groin  region I spoke with Dr. and he will evaluate for possible sports hernia and possible nerve block   Everlene Farrier, MD  [facility] [address] [place], [place] [phone]

## 2021-01-30 ENCOUNTER — Encounter: Payer: Self-pay | Admitting: Urology

## 2021-02-02 ENCOUNTER — Encounter: Payer: Self-pay | Admitting: *Deleted

## 2021-02-11 ENCOUNTER — Ambulatory Visit: Payer: BC Managed Care – PPO | Admitting: Surgery

## 2021-02-26 ENCOUNTER — Telehealth: Payer: BC Managed Care – PPO | Admitting: Physician Assistant

## 2021-02-26 DIAGNOSIS — M109 Gout, unspecified: Secondary | ICD-10-CM | POA: Diagnosis not present

## 2021-02-26 MED ORDER — COLCHICINE 0.6 MG PO TABS: ORAL_TABLET | ORAL | 0 refills | Status: DC

## 2021-02-26 NOTE — Progress Notes (Signed)
I have spent 5 minutes in review of e-visit questionnaire, review and updating patient chart, medical decision making and response to patient.   Marce Charlesworth Cody Nikolis Berent, PA-C    

## 2021-02-26 NOTE — Progress Notes (Signed)
E-Visit for Gout Symptoms  We are sorry that you are not feeling well. We are here to help!  Based on what you shared with me it looks like you have a flare of your gout.  Gout is a form of arthritis. It can cause pain and swelling in the joints. At first, it tends to affect only 1 joint - most frequently the big toe. It happens in people who have too much uric acid in the blood. Uric acid is a chemical that is produced when the body breaks down certain foods. Uric acid can form sharp needle-like crystals that build up in the joints and cause pain. Uric acid crystals can also form inside the tubes that carry urine from the kidneys to the bladder. These crystals can turn into "kidney stones" that can cause pain and problems with the flow of urine. People with gout get sudden "flares" or attacks of severe pain, most often the big toe, ankle, or knee. Often the joint also turns red and swells. Usually, only 1 joint is affected, but some people have pain in more than 1 joint. Gout flares tend to happen more often during the night.  The pain from gout can be extreme. The pain and swelling are worst at the beginning of a gout flare. The symptoms then get better within a few days to weeks. It is not clear how the body "turns off" a gout flare.  Do not start any NEW preventative medicine until the gout has cleared completely. However, If you are already on Probenecid or Allopurinol for CHRONIC gout, you may continue taking this during an active flare up  I have prescribed Colchicine 0.6 mg tabs - Take 2 tabs immediately, then 1 tab twice per day for the duration of the flare up to a max of 7 days (but discontinue for stomach pains or diarrhea)    HOME CARE Losing weight can help relieve gout. It's not clear that following a specific diet plan will help with gout symptoms but eating a balanced diet can help improve your overall health. It can also help you lose weight, if you are overweight. In general, a  healthy diet includes plenty of fruits, vegetables, whole grains, and low-fat dairy products (labelled "low fat", skim, 2%). Avoid sugar sweetened drinks (including sodas, tea, juice and juice blends, coffee drinks and sports drinks) Limit alcohol to 1-2 drinks of beer, spirits or wine daily these can make gout flares worse. Some people with gout also have other health problems, such as heart disease, high blood pressure, kidney disease, or obesity. If you have any of these issues, it's important to work with your doctor to manage them. This can help improve your overall health and might also help with your gout.  GET HELP RIGHT AWAY IF: Your symptoms persist after you have completed your treatment plan You develop severe diarrhea You develop abnormal sensations  You develop vomiting,   You develop weakness  You develop abdominal pain  FOLLOW UP WITH YOUR PRIMARY PROVIDER IF: If your symptoms do not improve within 10 days  MAKE SURE YOU  Understand these instructions. Will watch your condition. Will get help right away if you are not doing well or get worse.  Thank you for choosing an e-visit.  Your e-visit answers were reviewed by a board certified advanced clinical practitioner to complete your personal care plan. Depending upon the condition, your plan could have included both over the counter or prescription medications.  Please review your   pharmacy choice. Make sure the pharmacy is open so you can pick up prescription now. If there is a problem, you may contact your provider through MyChart messaging and have the prescription routed to another pharmacy.  Your safety is important to us. If you have drug allergies check your prescription carefully.   For the next 24 hours you can use MyChart to ask questions about today's visit, request a non-urgent call back, or ask for a work or school excuse. You will get an email in the next two days asking about your experience. I hope that your  e-visit has been valuable and will speed your recovery.  

## 2021-04-02 ENCOUNTER — Other Ambulatory Visit: Payer: Self-pay | Admitting: Family Medicine

## 2021-04-02 ENCOUNTER — Ambulatory Visit
Admission: RE | Admit: 2021-04-02 | Discharge: 2021-04-02 | Disposition: A | Discharge status: Home / Self Care | Payer: BC Managed Care – PPO | Source: Ambulatory Visit | Attending: Family Medicine | Admitting: Family Medicine

## 2021-04-02 DIAGNOSIS — R053 Chronic cough: Secondary | ICD-10-CM

## 2021-04-14 ENCOUNTER — Telehealth: Payer: BC Managed Care – PPO | Admitting: Physician Assistant

## 2021-04-14 DIAGNOSIS — M109 Gout, unspecified: Secondary | ICD-10-CM

## 2021-04-14 MED ORDER — PREDNISONE 20 MG PO TABS: 40.0000 mg | ORAL_TABLET | Freq: Every day | ORAL | 0 refills | Status: AC

## 2021-04-14 NOTE — Progress Notes (Signed)
E-Visit for Gout Symptoms  We are sorry that you are not feeling well. We are here to help!  Based on what you shared with me it looks like you have a flare of your gout.  Gout is a form of arthritis. It can cause pain and swelling in the joints. At first, it tends to affect only 1 joint - most frequently the big toe. It happens in people who have too much uric acid in the blood. Uric acid is a chemical that is produced when the body breaks down certain foods. Uric acid can form sharp needle-like crystals that build up in the joints and cause pain. Uric acid crystals can also form inside the tubes that carry urine from the kidneys to the bladder. These crystals can turn into "kidney stones" that can cause pain and problems with the flow of urine. People with gout get sudden "flares" or attacks of severe pain, most often the big toe, ankle, or knee. Often the joint also turns red and swells. Usually, only 1 joint is affected, but some people have pain in more than 1 joint. Gout flares tend to happen more often during the night.  The pain from gout can be extreme. The pain and swelling are worst at the beginning of a gout flare. The symptoms then get better within a few days to weeks. It is not clear how the body "turns off" a gout flare.  Do not start any NEW preventative medicine until the gout has cleared completely. However, If you are already on Probenecid or Allopurinol for CHRONIC gout, you may continue taking this during an active flare up  I have prescribed Prednisone 40 mg daily for 7 days.    HOME CARE Losing weight can help relieve gout. It's not clear that following a specific diet plan will help with gout symptoms but eating a balanced diet can help improve your overall health. It can also help you lose weight, if you are overweight. In general, a healthy diet includes plenty of fruits, vegetables, whole grains, and low-fat dairy products (labelled "low fat", skim, 2%). Avoid sugar  sweetened drinks (including sodas, tea, juice and juice blends, coffee drinks and sports drinks) Limit alcohol to 1-2 drinks of beer, spirits or wine daily these can make gout flares worse. Some people with gout also have other health problems, such as heart disease, high blood pressure, kidney disease, or obesity. If you have any of these issues, it's important to work with your doctor to manage them. This can help improve your overall health and might also help with your gout.  GET HELP RIGHT AWAY IF: Your symptoms persist after you have completed your treatment plan You develop severe diarrhea You develop abnormal sensations  You develop vomiting,   You develop weakness  You develop abdominal pain  FOLLOW UP WITH YOUR PRIMARY PROVIDER IF: If your symptoms do not improve within 10 days  MAKE SURE YOU  Understand these instructions. Will watch your condition. Will get help right away if you are not doing well or get worse.  Thank you for choosing an e-visit.  Your e-visit answers were reviewed by a board certified advanced clinical practitioner to complete your personal care plan. Depending upon the condition, your plan could have included both over the counter or prescription medications.  Please review your pharmacy choice. Make sure the pharmacy is open so you can pick up prescription now. If there is a problem, you may contact your provider through MyChart messaging   and have the prescription routed to another pharmacy.  Your safety is important to us. If you have drug allergies check your prescription carefully.   For the next 24 hours you can use MyChart to ask questions about today's visit, request a non-urgent call back, or ask for a work or school excuse. You will get an email in the next two days asking about your experience. I hope that your e-visit has been valuable and will speed your recovery.  

## 2021-04-14 NOTE — Progress Notes (Signed)
I have spent 5 minutes in review of e-visit questionnaire, review and updating patient chart, medical decision making and response to patient.   Lorrain Rivers Cody Katrisha Segall, PA-C    

## 2021-04-16 ENCOUNTER — Telehealth: Payer: BC Managed Care – PPO | Admitting: Physician Assistant

## 2021-04-16 DIAGNOSIS — M109 Gout, unspecified: Secondary | ICD-10-CM

## 2021-04-16 MED ORDER — COLCHICINE 0.6 MG PO TABS: ORAL_TABLET | ORAL | 0 refills | Status: DC

## 2021-04-16 NOTE — Progress Notes (Signed)
I have spent 5 minutes in review of e-visit questionnaire, review and updating patient chart, medical decision making and response to patient.   Amalie Koran Cody Nicki Furlan, PA-C    

## 2021-04-16 NOTE — Progress Notes (Signed)
E-Visit for Gout Symptoms  We are sorry that you are not feeling well. We are here to help!    Do not start any NEW preventative medicine until the gout has cleared completely. However, If you are already on Probenecid or Allopurinol for CHRONIC gout, you may continue taking this during an active flare up  I have prescribed Colchicine 0.6 mg tabs - Take 2 tabs immediately, then 1 tab twice per day for the duration of the flare up to a max of 7 days (but discontinue for stomach pains or diarrhea)  IF this is not calming it down, you must be evaluated in person.    HOME CARE Losing weight can help relieve gout. It's not clear that following a specific diet plan will help with gout symptoms but eating a balanced diet can help improve your overall health. It can also help you lose weight, if you are overweight. In general, a healthy diet includes plenty of fruits, vegetables, whole grains, and low-fat dairy products (labelled low fat, skim, 2%). Avoid sugar sweetened drinks (including sodas, tea, juice and juice blends, coffee drinks and sports drinks) Limit alcohol to 1-2 drinks of beer, spirits or wine daily these can make gout flares worse. Some people with gout also have other health problems, such as heart disease, high blood pressure, kidney disease, or obesity. If you have any of these issues, it's important to work with your doctor to manage them. This can help improve your overall health and might also help with your gout.  GET HELP RIGHT AWAY IF: Your symptoms persist after you have completed your treatment plan You develop severe diarrhea You develop abnormal sensations  You develop vomiting,   You develop weakness  You develop abdominal pain  FOLLOW UP WITH YOUR PRIMARY PROVIDER IF: If your symptoms do not improve within 10 days  MAKE SURE YOU  Understand these instructions. Will watch your condition. Will get help right away if you are not doing well or get worse.  Thank  you for choosing an e-visit.  Your e-visit answers were reviewed by a board certified advanced clinical practitioner to complete your personal care plan. Depending upon the condition, your plan could have included both over the counter or prescription medications.  Please review your pharmacy choice. Make sure the pharmacy is open so you can pick up prescription now. If there is a problem, you may contact your provider through [employer] and have the prescription routed to another pharmacy.  Your safety is important to [place]. If you have drug allergies check your prescription carefully.   For the next 24 hours you can use MyChart to ask questions about today's visit, request a non-urgent call back, or ask for a work or school excuse. You will get an email in the next two days asking about your experience. I hope that your e-visit has been valuable and will speed your recovery.

## 2021-04-18 ENCOUNTER — Ambulatory Visit
Admission: EM | Admit: 2021-04-18 | Discharge: 2021-04-18 | Disposition: A | Discharge status: Home / Self Care | Payer: BC Managed Care – PPO | Attending: Emergency Medicine | Admitting: Emergency Medicine

## 2021-04-18 ENCOUNTER — Ambulatory Visit (INDEPENDENT_AMBULATORY_CARE_PROVIDER_SITE_OTHER): Payer: BC Managed Care – PPO

## 2021-04-18 ENCOUNTER — Other Ambulatory Visit: Payer: Self-pay

## 2021-04-18 DIAGNOSIS — L03115 Cellulitis of right lower limb: Secondary | ICD-10-CM

## 2021-04-18 DIAGNOSIS — M79671 Pain in right foot: Secondary | ICD-10-CM

## 2021-04-18 HISTORY — DX: Gout, unspecified: M10.9

## 2021-04-18 LAB — CBC WITH DIFFERENTIAL/PLATELET
Abs Immature Granulocytes: 0.08 10*3/uL — ABNORMAL HIGH (ref 0.00–0.07)
Basophils Absolute: 0.1 10*3/uL (ref 0.0–0.1)
Basophils Relative: 1 %
Eosinophils Absolute: 0 10*3/uL (ref 0.0–0.5)
Eosinophils Relative: 0 %
HCT: 42.4 % (ref 39.0–52.0)
Hemoglobin: 15.7 g/dL (ref 13.0–17.0)
Immature Granulocytes: 1 %
Lymphocytes Relative: 12 %
Lymphs Abs: 1.1 10*3/uL (ref 0.7–4.0)
MCH: 34.1 pg — ABNORMAL HIGH (ref 26.0–34.0)
MCHC: 37 g/dL — ABNORMAL HIGH (ref 30.0–36.0)
MCV: 92.2 fL (ref 80.0–100.0)
Monocytes Absolute: 0.4 10*3/uL (ref 0.1–1.0)
Monocytes Relative: 4 %
Neutro Abs: 7.4 10*3/uL (ref 1.7–7.7)
Neutrophils Relative %: 82 %
Platelets: 184 10*3/uL (ref 150–400)
RBC: 4.6 MIL/uL (ref 4.22–5.81)
RDW: 12.9 % (ref 11.5–15.5)
WBC: 9 10*3/uL (ref 4.0–10.5)
nRBC: 0 % (ref 0.0–0.2)

## 2021-04-18 LAB — BASIC METABOLIC PANEL
Anion gap: 9 (ref 5–15)
BUN: 17 mg/dL (ref 6–20)
CO2: 27 mmol/L (ref 22–32)
Calcium: 8.5 mg/dL — ABNORMAL LOW (ref 8.9–10.3)
Chloride: 97 mmol/L — ABNORMAL LOW (ref 98–111)
Creatinine, Ser: 0.77 mg/dL (ref 0.61–1.24)
GFR, Estimated: >60 mL/min (ref >60)
Glucose, Bld: 130 mg/dL — ABNORMAL HIGH (ref 70–99)
Potassium: 3.6 mmol/L (ref 3.5–5.1)
Sodium: 133 mmol/L — ABNORMAL LOW (ref 135–145)

## 2021-04-18 MED ORDER — CEPHALEXIN 500 MG PO CAPS: 500.0000 mg | ORAL_CAPSULE | Freq: Four times a day (QID) | ORAL | 0 refills | Status: AC

## 2021-04-18 MED ORDER — NAPROXEN 500 MG PO TABS: 500.0000 mg | ORAL_TABLET | Freq: Two times a day (BID) | ORAL | 0 refills | Status: DC

## 2021-04-18 NOTE — Discharge Instructions (Addendum)
I am concerned that this could be a skin infection because you are being treated appropriately for gout and are getting worse despite this.  Try the Keflex.  Continue heat, ice, what ever feels better, elevation, discontinue the ibuprofen, start Naprosyn and take 2 extra strength Tylenols with it.  If you are not getting better in 24 hours or if you get worse in the interim, go immediately to the emergency department.

## 2021-04-18 NOTE — ED Provider Notes (Signed)
HPI  SUBJECTIVE:  Tracy Villarreal is a 51 y.o. male who presents with 6 days of right great toe/MTP swelling, pain, erythema, hypersensitivity. He had an E-visit on 2/7, thought to have gout, and was prescribed 40 milligrams of prednisone for 7 days.   Had another visit on 2/9, and was prescribed colchicine 0.6 mg 2 tabs x1 then 1 tab twice a day up to 7 days.  He is also taking ibuprofen 800 mg 3 times daily without improvement in his symptoms.  There are no alleviating factors.  Symptoms are worse with walking, palpation, anything touching his foot.  He states that the rest of his foot and other toes are starting to swell, which has never happened.  He drinks at least 64 ounces of water a day, has tried cherry juice.  He does report some dietary indiscretions.  No trauma, fevers. Patient has a past medical history of gout, hypertension.  No history of MRSA, chronic kidney disease, diabetes.  Past Medical History:  Diagnosis Date   Allergies    Arthritis    BIL FEET   Collapse of right lung    YEARS AGO   GERD (gastroesophageal reflux disease)    RARE-NO MEDS   Gout    History of kidney stones    H/O    Past Surgical History:  Procedure Laterality Date   NO PAST SURGERIES     UMBILICAL HERNIA REPAIR N/A 12/09/2017   Procedure: HERNIA REPAIR UMBILICAL ADULT;  Surgeon: Vickie Epley, MD;  Location: [facility] ORS;  Service: Vascular;  Laterality: N/A;    Family History  Problem Relation Age of Onset   Skin cancer Mother    Cancer Sister    Cancer Paternal Grandfather    Prostate cancer Neg Hx    Bladder Cancer Neg Hx    Kidney cancer Neg Hx     Social History   Tobacco Use   Smoking status: Never   Smokeless tobacco: Never  Vaping Use   Vaping Use: Never used  Substance Use Topics   Alcohol use: Yes    Alcohol/week: 8.0 standard drinks    Types: 8 Shots of liquor per week    Comment: OCC   Drug use: No    No current facility-administered medications for this  encounter.  Current Outpatient Medications:    amLODipine (NORVASC) 5 MG tablet, Take by mouth., Disp: , Rfl:    cephALEXin (KEFLEX) 500 MG capsule, Take 1 capsule (500 mg total) by mouth 4 (four) times daily for 5 days., Disp: 20 capsule, Rfl: 0   colchicine 0.6 MG tablet, Take 2 tablets by mouth once. Then 1 tablet at least 1 hour later. Starting the next day you can take 1 tab PO up to BID PRN., Disp: 10 tablet, Rfl: 0   fluticasone (FLONASE) 50 MCG/ACT nasal spray, Place 1 spray into both nostrils daily. , Disp: , Rfl: 6   naproxen (NAPROSYN) 500 MG tablet, Take 1 tablet (500 mg total) by mouth 2 (two) times daily., Disp: 20 tablet, Rfl: 0   predniSONE (DELTASONE) 20 MG tablet, Take 2 tablets (40 mg total) by mouth daily with breakfast for 7 days., Disp: 14 tablet, Rfl: 0  No Known Allergies   ROS  As noted in HPI.   Physical Exam  BP (!) 150/110 (BP Location: Left Arm)    Pulse 85    Temp 98 F (36.7 C) (Oral)    Resp 18    Ht 5\' 10"  (1.778 m)  Wt 97.5 kg    SpO2 99%    BMI 30.85 kg/m   Constitutional: Well developed, well nourished, appears uncomfortable Eyes:  EOMI, conjunctiva normal bilaterally HENT: Normocephalic, atraumatic,mucus membranes moist Respiratory: Normal inspiratory effort Cardiovascular: Normal rate GI: nondistended skin: No rash, skin intact Musculoskeletal: Entire right foot swollen.  Erythema, tenderness along the first toe and at the first MTP.  Increased temperature.  Skin intact.  Pain with flexion/extension of the toe.  DP 2+.        Neurologic: Alert & oriented x 3, no focal neuro deficits Psychiatric: Speech and behavior appropriate   ED Course   Medications - No data to display  Orders Placed This Encounter  Procedures   DG Foot Complete Right    Standing Status:   Standing    Number of Occurrences:   1    Order Specific Question:   Reason for Exam (SYMPTOM  OR DIAGNOSIS REQUIRED)    Answer:   Pain, swelling at first MTP, history  of gout   CBC with Differential    Standing Status:   Standing    Number of Occurrences:   1   Basic metabolic panel    Standing Status:   Standing    Number of Occurrences:   1    Results for orders placed or performed during the hospital encounter of 04/18/21 (from the past 24 hour(s))  CBC with Differential     Status: Abnormal   Collection Time: 04/18/21 10:32 AM  Result Value Ref Range   WBC 9.0 4.0 - 10.5 K/uL   RBC 4.60 4.22 - 5.81 MIL/uL   Hemoglobin 15.7 13.0 - 17.0 g/dL   HCT 84.1 66.0 - 63.0 %   MCV 92.2 80.0 - 100.0 fL   MCH 34.1 (H) 26.0 - 34.0 pg   MCHC 37.0 (H) 30.0 - 36.0 g/dL   RDW 16.0 10.9 - 32.3 %   Platelets 184 150 - 400 K/uL   nRBC 0.0 0.0 - 0.2 %   Neutrophils Relative % 82 %   Neutro Abs 7.4 1.7 - 7.7 K/uL   Lymphocytes Relative 12 %   Lymphs Abs 1.1 0.7 - 4.0 K/uL   Monocytes Relative 4 %   Monocytes Absolute 0.4 0.1 - 1.0 K/uL   Eosinophils Relative 0 %   Eosinophils Absolute 0.0 0.0 - 0.5 K/uL   Basophils Relative 1 %   Basophils Absolute 0.1 0.0 - 0.1 K/uL   Immature Granulocytes 1 %   Abs Immature Granulocytes 0.08 (H) 0.00 - 0.07 K/uL  Basic metabolic panel     Status: Abnormal   Collection Time: 04/18/21 10:32 AM  Result Value Ref Range   Sodium 133 (L) 135 - 145 mmol/L   Potassium 3.6 3.5 - 5.1 mmol/L   Chloride 97 (L) 98 - 111 mmol/L   CO2 27 22 - 32 mmol/L   Glucose, Bld 130 (H) 70 - 99 mg/dL   BUN 17 6 - 20 mg/dL   Creatinine, Ser 5.57 0.61 - 1.24 mg/dL   Calcium 8.5 (L) 8.9 - 10.3 mg/dL   GFR, Estimated >32 >20 mL/min   Anion gap 9 5 - 15   DG Foot Complete Right  Result Date: 04/18/2021 CLINICAL DATA:  Pain and swelling at the first MTP joint. History of gout. EXAM: RIGHT FOOT COMPLETE - 3+ VIEW COMPARISON:  None. FINDINGS: No fracture or bone lesion. Joints are normally spaced and aligned. No erosions or other arthropathic changes. Mild soft tissue swelling  medial to the first metatarsophalangeal joint. Moderate-sized dorsal  calcaneal spur. IMPRESSION: 1. No fracture or bone lesion. 2. No joint abnormality. Soft tissue swelling adjacent to the first MTP joint which may reflect gout without joint or skeletal changes. Electronically Signed   By: Lajean Manes M.D.   On: 04/18/2021 11:28    ED Clinical Impression  1. Right foot pain   2. Cellulitis of right lower extremity      ED Assessment/Plan  Outside records reviewed.  As noted in HPI.  Skin is intact, do not see any portal for infection, but given that the patient has been appropriately treated for gout, with NSAIDs, colchicine and prednisone concern for cellulitis.  Septic joint in the differential, but low.  Will check CBC, BMP,  and a foot x-ray to rule out osteomyelitis.   Reviewed imaging independently.  Soft tissue swelling adjacent to the first MTP joint.  No fracture, bone lesion.  See radiology report for full details.  No leukocytosis, BMP unremarkable.  Normal kidney function.  X-ray significant for soft tissue swelling medial to the MTP joint which could reflect gout.  There is no evidence of osteomyelitis.  Offered shot of Rocephin for cellulitis, patient declined.   will start him on Keflex 500 mg 4 times daily for 5 days.  Discontinue ibuprofen start Naprosyn with Tylenol.  If not getting any better in 24 hours or if he gets worse in the interim, then he is to go to the ED.   Discussed labs, imaging, MDM, treatment plan, and plan for follow-up with patient. Discussed sn/sx that should prompt return to the ED. patient agrees with plan.   Meds ordered this encounter  Medications   cephALEXin (KEFLEX) 500 MG capsule    Sig: Take 1 capsule (500 mg total) by mouth 4 (four) times daily for 5 days.    Dispense:  20 capsule    Refill:  0   naproxen (NAPROSYN) 500 MG tablet    Sig: Take 1 tablet (500 mg total) by mouth 2 (two) times daily.    Dispense:  20 tablet    Refill:  0      *This clinic note was created using Theatre manager. Therefore, there may be occasional mistakes despite careful proofreading.  ?    Melynda Ripple, MD 04/18/21 1145

## 2021-04-18 NOTE — ED Triage Notes (Signed)
Pt here with C/O gout flare up on right foot, did E-visit Tuesday was given Prednisone and wasn't better by Thursday did another e-vist was given Colchicine. Pt states it still feels the same, unable to work.

## 2021-06-10 ENCOUNTER — Other Ambulatory Visit: Payer: Self-pay | Admitting: Family Medicine

## 2021-06-10 DIAGNOSIS — R053 Chronic cough: Secondary | ICD-10-CM

## 2021-07-14 ENCOUNTER — Ambulatory Visit
Admission: RE | Admit: 2021-07-14 | Discharge: 2021-07-14 | Disposition: A | Discharge status: Home / Self Care | Payer: BC Managed Care – PPO | Source: Ambulatory Visit | Attending: Family Medicine | Admitting: Family Medicine

## 2021-07-14 DIAGNOSIS — R053 Chronic cough: Secondary | ICD-10-CM

## 2021-07-16 ENCOUNTER — Other Ambulatory Visit: Payer: Self-pay | Admitting: Gastroenterology

## 2021-07-17 ENCOUNTER — Encounter (HOSPITAL_COMMUNITY)
Admission: RE | Disposition: A | Discharge status: Home / Self Care | Payer: Self-pay | Source: Home / Self Care | Attending: Gastroenterology

## 2021-07-17 ENCOUNTER — Ambulatory Visit (HOSPITAL_COMMUNITY)
Admission: RE | Admit: 2021-07-17 | Discharge: 2021-07-17 | Disposition: A | Discharge status: Home / Self Care | Payer: BC Managed Care – PPO | Attending: Gastroenterology | Admitting: Gastroenterology

## 2021-07-17 ENCOUNTER — Ambulatory Visit (HOSPITAL_COMMUNITY): Payer: BC Managed Care – PPO | Admitting: Anesthesiology

## 2021-07-17 ENCOUNTER — Encounter (HOSPITAL_COMMUNITY): Payer: Self-pay | Admitting: Gastroenterology

## 2021-07-17 DIAGNOSIS — K2950 Unspecified chronic gastritis without bleeding: Secondary | ICD-10-CM | POA: Insufficient documentation

## 2021-07-17 DIAGNOSIS — K219 Gastro-esophageal reflux disease without esophagitis: Secondary | ICD-10-CM | POA: Insufficient documentation

## 2021-07-17 DIAGNOSIS — K2210 Ulcer of esophagus without bleeding: Secondary | ICD-10-CM | POA: Diagnosis not present

## 2021-07-17 DIAGNOSIS — K224 Dyskinesia of esophagus: Secondary | ICD-10-CM | POA: Insufficient documentation

## 2021-07-17 DIAGNOSIS — R053 Chronic cough: Secondary | ICD-10-CM | POA: Diagnosis not present

## 2021-07-17 DIAGNOSIS — R933 Abnormal findings on diagnostic imaging of other parts of digestive tract: Secondary | ICD-10-CM | POA: Diagnosis present

## 2021-07-17 HISTORY — PX: ESOPHAGOGASTRODUODENOSCOPY (EGD) WITH PROPOFOL: SHX5813

## 2021-07-17 HISTORY — PX: BIOPSY: SHX5522

## 2021-07-17 SURGERY — ESOPHAGOGASTRODUODENOSCOPY (EGD) WITH PROPOFOL
Anesthesia: Monitor Anesthesia Care

## 2021-07-17 MED ORDER — SODIUM CHLORIDE 0.9 % IV SOLN: INTRAVENOUS | Status: DC

## 2021-07-17 MED ORDER — LACTATED RINGERS IV SOLN
INTRAVENOUS | Status: DC
  Administered 2021-07-17: 1000 mL via INTRAVENOUS

## 2021-07-17 MED ORDER — PROPOFOL 500 MG/50ML IV EMUL
INTRAVENOUS | Status: DC | PRN
  Administered 2021-07-17: 125 ug/kg/min via INTRAVENOUS

## 2021-07-17 MED ORDER — LIDOCAINE 2% (20 MG/ML) 5 ML SYRINGE
INTRAMUSCULAR | Status: DC | PRN
Start: 2021-07-17 — End: 2021-07-17
  Administered 2021-07-17: 100 mg via INTRAVENOUS

## 2021-07-17 MED ORDER — PROPOFOL 10 MG/ML IV BOLUS
INTRAVENOUS | Status: DC | PRN
  Administered 2021-07-17: 50 mg via INTRAVENOUS
  Administered 2021-07-17: 30 mg via INTRAVENOUS

## 2021-07-17 MED ORDER — PANTOPRAZOLE SODIUM 40 MG PO TBEC
40.0000 mg | DELAYED_RELEASE_TABLET | Freq: Every day | ORAL | 1 refills | Status: DC
Start: 2021-07-17 — End: 2023-07-04

## 2021-07-17 SURGICAL SUPPLY — 15 items

## 2021-07-17 NOTE — H&P (Signed)
HPI:  ?   General:   ?       51 year old male is referred by Dr. Jerold Coombe for abnormal barium swallow test. Patient has had a chronic cough for one year. Normal chest x-ray 03/2021. Barium swallow with tablet done 07/14/21, to evaluate persistent cough, showed mild to moderate esophageal dysmotility and irregular eccentric lobular filling defect at the EG junction with associated luminal narrowing. Differential included esophageal neoplasm versus esophageal varices. EGD was recommended. Of note, there was no hiatal hernia or GERD elicited. Next ?       Patient denies abdominal pain, unintentional weight loss, or solid food dysphagia symptoms. When he eats or drinks cold liquids this triggers immediate coughing spells. He has mild globus sensation in his throat. He has not had any nausea, vomiting, or difficulty swallowing food at all. Denies melena, hematochezia, or bowel irregularities.  ?  ? ? ROS:   ?   GI PROCEDURE:   ?       Pacemaker/ AICD no.  Artificial heart valves no.  MI/heart attack no.  Abnormal heart rhythm no.  Angina no.  CVA no.  Hypertension YES.  Hypotension no.  Asthma, COPD no.  Sleep apnea no.  Seizure disorders no.  Artificial joints no.  Severe DJD no.  Diabetes no.  Significant headaches no.  Vertigo no.  Depression/anxiety YES.  Abnormal bleeding no.  Kidney Disease no.  Liver disease no.  Chance of pregnancy no.  Blood transfusion no.      ?     ?  ? ? Medical History:  Hypertension (05/2019), Depression/Anxiety (12/2006), Gout (07/2006), Recurrent aphthous ulcers, Hydro cell. ?  ? ? Surgical History: Umbilical hernia repair with mesh 12/2017.    ? Hospitalization/Major Diagnostic Procedure: not in the past year 07/16/2021.   ?  ? ? Family History:  No HTN.   ? ? Social History:   ?   General:  ?        Tobacco use   ?           cigarettes:  Never smoked ?           Tobacco history last updated  07/16/2021 ?        no EXPOSURE TO PASSIVE SMOKE.  ?        no Recreational drug use.  ?         Marital Status: married.   ?  ? ? Medications: Taking Flonase(Fluticasone Propionate) 50 MCG/ACT Suspension 1-2 sprays Nasally Once a day as needed for allergies, Taking Goodys Extra Strength(Aspirin-Acetaminophen-Caffeine) 520-260-32.5 MG Packet 1 packet as needed Orally as needed, Taking Famotidine 20 MG Tablet 1 tablet Orally twice a day for acid reflux, Taking amLODIPine Besylate 5 MG Tablet 1 tablet Orally Once a day, Not-Taking Esomeprazole Magnesium 20 MG Capsule Delayed Release 1 capsule Orally twice a day for acid reflux (OVER THE COUNTER), Medication List reviewed and reconciled with the patient  ? ? Vitals: Wt 212.4, Wt change -1.6 lbs, Ht 70, BMI 30.47, Temp 98.4, Pulse sitting 80, BP sitting 141/93 ?first bp 147/100.  ?  ? ? Examination:  ?    Gastroenterology Exam: ?        GENERAL APPEARANCE: Well developed, well nourished, no active distress, pleasant, no acute distress.  ?       SCLERA: anicteric.  ?       RESPIRATORY Breath sounds clear to auscultation. No wheezes, rales or rhonchi. Respiration even and unlabored.  ?  CARDIOVASCULAR Normal RRR w/o murmers or gallops. No peripheral edema.  ?       ABDOMEN No masses palpated. Liver and spleen not palpated, normal. Bowel sounds normal, Abdomen not distended; When I press on the epigastrium he has uncomfortable sensation in his throat. Otherwise no abdominal tenderness or masses..  ?       PSYCHIATRIC Alert and oriented x3, mood and affect appear normal..      ? ?Assessment: ?1. Abnormal barium swallow - R93.3 (Primary)   ?2. Chronic cough - R05.3   ?3. Esophageal dysmotility - K22.4    ? ?Plan: ? ? ?1. Abnormal barium swallow  ?      Imaging: EGD w/ DILATATION ?             Notes: I discussed risks of EGD w/ DIL with patient today, including risk of sedation, bleeding or perforation. Patient expressed understanding and agrees to proceed with procedure. ? ?*Evaluate for Esophageal Neoplasm verses Varices?.     ?  ?2. Chronic cough  ?Notes: Chest  x-ray 03/2021 was normal.     ?  ?  ? ? ?

## 2021-07-17 NOTE — Transfer of Care (Signed)
Immediate Anesthesia Transfer of Care Note ? ?Patient: Tracy Villarreal ? ?Procedure(s) Performed: ESOPHAGOGASTRODUODENOSCOPY (EGD) WITH PROPOFOL ?BIOPSY ? ?Patient Location: PACU ? ?Anesthesia Type:MAC ? ?Level of Consciousness: awake, alert  and oriented ? ?Airway & Oxygen Therapy: Patient Spontanous Breathing and Patient connected to face mask oxygen ? ?Post-op Assessment: Report given to RN and Post -op Vital signs reviewed and stable ? ?Post vital signs: Reviewed and stable ? ?Last Vitals:  ?Vitals Value Taken Time  ?BP    ?Temp    ?Pulse 60 07/17/21 1414  ?Resp 17 07/17/21 1414  ?SpO2 100 % 07/17/21 1414  ?Vitals shown include unvalidated device data. ? ?Last Pain:  ?Vitals:  ? 07/17/21 1323  ?TempSrc: Tympanic  ?PainSc: 0-No pain  ?   ? ?  ? ?Complications: No notable events documented. ?

## 2021-07-17 NOTE — Interval H&P Note (Signed)
History and Physical Interval Note: ?51/male with abnormal barium swallow?esophageal mass vs varices for an EGD with propofol, possible banding. 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 ? ?07/17/2021 ?1:32 PM ? ?Tracy Villarreal  has presented today for EGD with possible banding, with the diagnosis of Abnormal Barium Swallow.  The various methods of treatment have been discussed with the patient and family. After consideration of risks, benefits and other options for treatment, the patient has consented to  Procedure(s): ?ESOPHAGOGASTRODUODENOSCOPY (EGD) WITH PROPOFOL (N/A) ?ESOPHAGEAL BANDING (N/A) ?BALLOON DILATION (N/A) as a surgical intervention.  The patient's history has been reviewed, patient examined, no change in status, stable for surgery.  I have reviewed the patient's chart and labs.  Questions were answered to the patient's satisfaction.   ? ? ?Kerin Salen ? ? ?

## 2021-07-17 NOTE — Anesthesia Postprocedure Evaluation (Signed)
Anesthesia Post Note ? ?Patient: Tracy Villarreal ? ?Procedure(s) Performed: ESOPHAGOGASTRODUODENOSCOPY (EGD) WITH PROPOFOL ?BIOPSY ? ?  ? ?Patient location during evaluation: Endoscopy ?Anesthesia Type: MAC ?Level of consciousness: awake and alert ?Pain management: pain level controlled ?Vital Signs Assessment: post-procedure vital signs reviewed and stable ?Respiratory status: spontaneous breathing, nonlabored ventilation and respiratory function stable ?Cardiovascular status: blood pressure returned to baseline and stable ?Postop Assessment: no apparent nausea or vomiting ?Anesthetic complications: no ? ? ?No notable events documented. ? ?Last Vitals:  ?Vitals:  ? 07/17/21 1451 07/17/21 1452  ?BP:    ?Pulse: (!) 57 (!) 59  ?Resp: 16 18  ?Temp:    ?SpO2: 98% 97%  ?  ?Last Pain:  ?Vitals:  ? 07/17/21 1414  ?TempSrc: Temporal  ?PainSc: 0-No pain  ? ? ?  ?  ?  ?  ?  ?  ? ?Merlinda Frederick ? ? ? ? ?

## 2021-07-17 NOTE — Anesthesia Preprocedure Evaluation (Signed)
Anesthesia Evaluation  ?Patient identified by MRN, date of birth, ID band ?Patient awake ? ? ? ?Reviewed: ?Allergy & Precautions, NPO status , Patient's Chart, lab work & pertinent test results ? ?History of Anesthesia Complications ?Negative for: history of anesthetic complications ? ?Airway ?Mallampati: III ? ?TM Distance: >3 FB ?Neck ROM: Full ? ? ? Dental ?no notable dental hx. ? ?  ?Pulmonary ?neg pulmonary ROS,  ?  ?breath sounds clear to auscultation ? ? ? ? ? ? Cardiovascular ?Exercise Tolerance: Good ? ?Rhythm:Regular Rate:Normal ? ? ?  ?Neuro/Psych ?negative neurological ROS ? negative psych ROS  ? GI/Hepatic ?Neg liver ROS, GERD  ,  ?Endo/Other  ?negative endocrine ROS ? Renal/GU ?negative Renal ROS  ? ?  ?Musculoskeletal ? ?(+) Arthritis ,  ? Abdominal ?(+) + obese,   ?Peds ? Hematology ?negative hematology ROS ?(+)   ?Anesthesia Other Findings ? ? Reproductive/Obstetrics ? ?  ? ? ? ? ? ? ? ? ? ? ? ? ? ?  ?  ? ? ? ? ? ? ? ? ?Anesthesia Physical ?Anesthesia Plan ? ?ASA: 2 ? ?Anesthesia Plan: MAC  ? ?Post-op Pain Management:   ? ?Induction: Intravenous ? ?PONV Risk Score and Plan: 1 and Propofol infusion, TIVA and Treatment may vary due to age or medical condition ? ?Airway Management Planned: Natural Airway ? ?Additional Equipment: None ? ?Intra-op Plan:  ? ?Post-operative Plan:  ? ?Informed Consent: I have reviewed the patients History and Physical, chart, labs and discussed the procedure including the risks, benefits and alternatives for the proposed anesthesia with the patient or authorized representative who has indicated his/her understanding and acceptance.  ? ? ? ? ? ?Plan Discussed with: CRNA, Surgeon and Anesthesiologist ? ?Anesthesia Plan Comments:   ? ? ? ? ? ? ?Anesthesia Quick Evaluation ? ?

## 2021-07-17 NOTE — Op Note (Signed)
[facility] ?Patient Name: Tracy Villarreal ?Procedure Date: 07/17/2021 ?MRN: [id number] ?Attending MD: Kerin Salen , MD ?Date of Birth: [birth year] ?CSN: [id number] ?Age: 51 ?Admit Type: Outpatient ?Procedure:                Upper GI endoscopy ?Indications:              Abnormal cine-esophagram, barium swallow showed  ?                          esophageal mass vs esopahgeal varices ?Providers:                Kerin Salen, MD, Fransisca Connors, Janie Billups,  ?                          Technician, [employer], CRNA ?Referring MD:             Starr Sinclair ?Medicines:                Monitored Anesthesia Care ?Complications:            No immediate complications. Estimated blood loss:  ?                          Minimal. ?Estimated Blood Loss:     Estimated blood loss was minimal. ?Procedure:                Pre-Anesthesia Assessment: ?                          - Prior to the procedure, a History and Physical  ?                          was performed, and patient medications and  ?                          allergies were reviewed. The patient's tolerance of  ?                          previous anesthesia was also reviewed. The risks  ?                          and benefits of the procedure and the sedation  ?                          options and risks were discussed with the patient.  ?                          All questions were answered, and informed consent  ?                          was obtained. Prior Anticoagulants: The patient has  ?                          taken no previous anticoagulant or antiplatelet  ?  agents. ASA Grade Assessment: II - A patient with  ?                          mild systemic disease. After reviewing the risks  ?                          and benefits, the patient was deemed in  ?                          satisfactory condition to undergo the procedure. ?                          After obtaining informed consent, the endoscope was  ?                           passed under direct vision. Throughout the  ?                          procedure, the patient's blood pressure, pulse, and  ?                          oxygen saturations were monitored continuously. The  ?                          [id number]-[id number] ([id number]) Olympus endoscope was introduced  ?                          through the mouth, and advanced to the second part  ?                          of duodenum. The upper GI endoscopy was  ?                          accomplished without difficulty. The patient  ?                          tolerated the procedure well. ?Scope In: ?Scope Out: ?Findings: ?     The upper third of the esophagus and middle third of the esophagus were  ?     normal. ?     There were esophageal mucosal changes suggestive of short-segment  ?     Barrett's esophagus present in the lower third of the esophagus. The  ?     maximum longitudinal extent of these mucosal changes was 1 cm in length.  ?     Mucosa was biopsied with a cold forceps for histology in a targeted  ?     manner at intervals of 1 cm from 39 to 40 cm from the incisors. One  ?     specimen bottle was sent to pathology. ?     A single 7 mm papule (nodule) with minimal bleeding/oozing on lavage and  ?     no stigmata of recent bleeding was found in the cardia, immediately  ?     below the GE junction. Biopsies were taken with a cold forceps for  ?     histology. ?  The gastric fundus, gastric body, greater curvature of the stomach,  ?     lesser curvature of the stomach, incisura, gastric antrum and pylorus  ?     were normal. ?     The cardia and gastric fundus were otherwise normal on retroflexion. ?     The examined duodenum was normal. ?Impression:               - Normal upper third of esophagus and middle third  ?                          of esophagus. ?                          - Esophageal mucosal changes suggestive of  ?                          short-segment Barrett's esophagus. Biopsied. ?                           - A single papule (nodule) found in the stomach.  ?                          Biopsied. ?                          - Normal gastric fundus, gastric body, greater  ?                          curvature of the stomach, lesser curvature of the  ?                          stomach, incisura, antrum and pylorus. ?                          - Normal examined duodenum. ?Moderate Sedation: ?     Patient did not receive moderate sedation for this procedure, but  ?     instead received monitored anesthesia care. ?Recommendation:           - Await pathology results. ?                          - Use Protonix (pantoprazole) 40 mg PO daily for 2  ?                          weeks. ?                          - Patient has a contact number available for  ?                          emergencies. The signs and symptoms of potential  ?                          delayed complications were discussed with the  ?  patient. Return to normal activities tomorrow.  ?                          Written discharge instructions were provided to the  ?                          patient. ?Procedure Code(s):        --- Professional --- ?                          [phone], Esophagogastroduodenoscopy, flexible,  ?                          transoral; with biopsy, single or multiple ?Diagnosis Code(s):        --- Professional --- ?                          K22.8, Other specified diseases of esophagus ?                          K31.89, Other diseases of stomach and duodenum ?                          R93.3, Abnormal findings on diagnostic imaging of  ?                          other parts of digestive tract ?CPT copyright 2019 [employer]. All rights reserved. ?The codes documented in this report are preliminary and upon coder review may  ?be revised to meet current compliance requirements. ?Kerin SalenArya Jurline Folger, MD ?07/17/2021 2:15:46 PM ?This report has been signed electronically. ?Number of Addenda: 0 ?

## 2021-07-20 ENCOUNTER — Encounter (HOSPITAL_COMMUNITY): Payer: Self-pay | Admitting: Gastroenterology

## 2021-07-20 LAB — SURGICAL PATHOLOGY

## 2021-08-21 ENCOUNTER — Ambulatory Visit
Admission: EM | Admit: 2021-08-21 | Discharge: 2021-08-21 | Disposition: A | Discharge status: Home / Self Care | Payer: BC Managed Care – PPO | Attending: Emergency Medicine | Admitting: Emergency Medicine

## 2021-08-21 DIAGNOSIS — M7662 Achilles tendinitis, left leg: Secondary | ICD-10-CM | POA: Diagnosis not present

## 2021-08-21 MED ORDER — PREDNISONE 10 MG (21) PO TBPK
ORAL_TABLET | ORAL | 0 refills | Status: DC
Start: 2021-08-21 — End: 2021-12-31

## 2021-08-21 NOTE — ED Triage Notes (Signed)
C/o left foot pain for several days. H/o of heel pain in the past.

## 2021-08-21 NOTE — Discharge Instructions (Addendum)
Take the prednisone as directed for the next 6 days to decrease inflammation.  Stretch your achilles tendon 2-3 times a day to help prevent rupture.  Elevate your heel as much as possible.  Apply moist heat to your heel for 20 minutes at a time, 2-3 times a day, to help improve blood flow and decrease inflammation in the Achilles tendon.  If you develop any increase in pain follow-up with podiatry. If you develop sharp pain in your heel go to the ER for evaluation.

## 2021-08-21 NOTE — ED Provider Notes (Signed)
[facility]    CSN: [id number] Arrival date & time: 08/21/21  0936      History   Chief Complaint Chief Complaint  Patient presents with   Foot Pain    HPI Tracy Villarreal is a 51 y.o. male.   HPI  51 year old male here for evaluation of left foot pain.  Patient reports that for the last 2 days she has been experiencing pain in the back of his left heel.  There is no swelling or bruising noted.  He denies any numbness or tingling in his toes.  He states that he had some mild pain when he lifted his foot up on Tuesday but nothing was significant.  When he got up Wednesday morning and attempted to walk he had pain with both weightbearing and when lifting his foot.  He denies any injury.  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  He also works on English as a second language teacher.  He reports that he has been changing on his footwear between 3 different pairs of shoes when he is at work.  Past Medical History:  Diagnosis Date   Allergies    Arthritis    BIL FEET   Collapse of right lung    YEARS AGO   GERD (gastroesophageal reflux disease)    RARE-NO MEDS   Gout    History of kidney stones    H/O    Patient Active Problem List   Diagnosis Date Noted   Umbilical hernia without obstruction and without gangrene    Gout 02/17/2016   Hearing loss 02/17/2016    Past Surgical History:  Procedure Laterality Date   BIOPSY  07/17/2021   Procedure: BIOPSY;  Surgeon: Kerin Salen, MD;  Location: [facility];  Service: Gastroenterology;;   ESOPHAGOGASTRODUODENOSCOPY (EGD) WITH PROPOFOL N/A 07/17/2021   Procedure: ESOPHAGOGASTRODUODENOSCOPY (EGD) WITH PROPOFOL;  Surgeon: Kerin Salen, MD;  Location: [facility];  Service: Gastroenterology;  Laterality: N/A;   NO PAST SURGERIES     UMBILICAL HERNIA REPAIR N/A 12/09/2017   Procedure: HERNIA REPAIR UMBILICAL ADULT;  Surgeon: Ancil Linsey, MD;  Location: [facility];  Service: Vascular;  Laterality:  N/A;       Home Medications    Prior to Admission medications   Medication Sig Start Date End Date Taking? Authorizing Provider  predniSONE (STERAPRED UNI-PAK 21 TAB) 10 MG (21) TBPK tablet Take 6 tablets on day 1, 5 tablets day 2, 4 tablets day 3, 3 tablets day 4, 2 tablets day 5, 1 tablet day 6 08/21/21  Yes Becky Augusta, NP  amLODipine (NORVASC) 5 MG tablet Take by mouth.    [provider]  cetirizine (ZYRTEC) 10 MG tablet Take 10 mg by mouth daily.    [provider]  colchicine 0.6 MG tablet Take 2 tablets by mouth once. Then 1 tablet at least 1 hour later. Starting the next day you can take 1 tab PO up to BID PRN. 04/16/21   Waldon Merl, PA-C  famotidine (PEPCID) 20 MG tablet Take 20 mg by mouth 2 (two) times daily.    [provider]  fluticasone (FLONASE) 50 MCG/ACT nasal spray Place 1 spray into both nostrils daily.  11/28/17   [provider]  pantoprazole (PROTONIX) 40 MG tablet Take 1 tablet (40 mg total) by mouth daily. 07/17/21 07/17/22  Kerin Salen, MD    Family History Family History  Problem Relation Age of Onset   Skin cancer Mother  Cancer Sister    Cancer Paternal Grandfather    Prostate cancer Neg Hx    Bladder Cancer Neg Hx    Kidney cancer Neg Hx     Social History Social History   Tobacco Use   Smoking status: Never   Smokeless tobacco: Never  Vaping Use   Vaping Use: Never used  Substance Use Topics   Alcohol use: Yes    Alcohol/week: 8.0 standard drinks of alcohol    Types: 8 Shots of liquor per week    Comment: OCC   Drug use: No     Allergies   Patient has no known allergies.   Review of Systems Review of Systems  Musculoskeletal:  Positive for arthralgias and myalgias. Negative for joint swelling.  Skin:  Negative for color change and wound.  Neurological:  Negative for weakness and numbness.  Hematological: Negative.   Psychiatric/Behavioral: Negative.       Physical Exam Triage Vital  Signs ED Triage Vitals  Enc Vitals Group     BP 08/21/21 1032 (!) 144/96     Pulse Rate 08/21/21 1032 70     Resp 08/21/21 1032 18     Temp 08/21/21 1032 98.6 F (37 C)     Temp Source 08/21/21 1032 Oral     SpO2 08/21/21 1032 99 %     Weight --      Height --      Head Circumference --      Peak Flow --      Pain Score 08/21/21 1035 7     Pain Loc --      Pain Edu? --      Excl. in GC? --    No data found.  Updated Vital Signs BP (!) 144/96 (BP Location: Left Arm)   Pulse 70   Temp 98.6 F (37 C) (Oral)   Resp 18   SpO2 99%   Visual Acuity Right Eye Distance:   Left Eye Distance:   Bilateral Distance:    Right Eye Near:   Left Eye Near:    Bilateral Near:     Physical Exam Vitals and nursing note reviewed.  Constitutional:      Appearance: Normal appearance. He is not ill-appearing.  HENT:     Head: Normocephalic and atraumatic.  Musculoskeletal:        General: Tenderness present. No swelling, deformity or signs of injury. Normal range of motion.  Skin:    General: Skin is warm and dry.     Capillary Refill: Capillary refill takes less than 2 seconds.     Findings: No bruising or erythema.  Neurological:     General: No focal deficit present.     Mental Status: He is alert and oriented to person, place, and time.  Psychiatric:        Mood and Affect: Mood normal.        Behavior: Behavior normal.        Thought Content: Thought content normal.        Judgment: Judgment normal.      UC Treatments / Results  Labs (all labs ordered are listed, but only abnormal results are displayed) Labs Reviewed - No data to display  EKG   Radiology No results found.  Procedures Procedures (including critical care time)  Medications Ordered in UC Medications - No data to display  Initial Impression / Assessment and Plan / UC Course  I have reviewed the triage vital signs and the nursing  notes.  Pertinent labs & imaging results that were available  during my care of the patient were reviewed by me and considered in my medical decision making (see chart for details).  Patient is a nontoxic-appearing 51 year old male here for evaluation of left heel pain has been going for the past 3 days.  Patient indicates that the pain is in the posterior aspect of his left heel where his Achilles inserts into his calcaneus.  He denies any swelling, redness, or bruising.  No numbness or tingling in his toes.  The pain is present with weightbearing and nonweightbearing.  On exam patient's foot and ankle are in normal anatomical alignment and there is no edema, erythema, or ecchymosis noted.  His DP and PT pulses are 2+.  He has full range of motion sensation all of his toes.  There is no pain with palpation of the plantar aspect of the calcaneus or when palpating the arch and the plantar fascia.  He does have tenderness with palpation of the insertion of the Achilles tendon into the calcaneus.  There is no tenderness of the Achilles tendon itself or the gastrocnemius.  Patient's exam is consistent with insertional Achilles tendinitis.  I will give him a work note so he has the next 2 days off to rest and elevate his left foot.  I have also instructed him to apply moist heat for 20 minutes at a time 2-3 times a day to help improve blood flow and ease the inflammation of the tendon.  I have also given him stretches to perform at home 2-3 times a day to help keep the Achilles tendon pliable and minimize potential of rupture.  Due to his degree of discomfort I will put him on a prednisone taper as well.  I have instructed him to follow-up with podiatry or orthopedics if his pain does not improve.  Of also instructed him that if he feels a sharpness and pain or pop in his Achilles that he should go to the ER for evaluation to rule out a tendon rupture.   Final Clinical Impressions(s) / UC Diagnoses   Final diagnoses:  Achilles tendinitis of left lower extremity      Discharge Instructions      Take the prednisone as directed for the next 6 days.  Stretch your achilles tendon 2-3 times a day to help prevent rupture.  Elevate your heel as much as possible.  If you develop any increase in pain follow-up with podiatry. If you develop sharp pain in your heel go to the ER for evaluation.     ED Prescriptions     Medication Sig Dispense Auth. Provider   predniSONE (STERAPRED UNI-PAK 21 TAB) 10 MG (21) TBPK tablet Take 6 tablets on day 1, 5 tablets day 2, 4 tablets day 3, 3 tablets day 4, 2 tablets day 5, 1 tablet day 6 21 tablet Becky Augusta, NP      PDMP not reviewed this encounter.   Becky Augusta, NP 08/21/21 1102

## 2021-12-29 ENCOUNTER — Ambulatory Visit
Admission: EM | Admit: 2021-12-29 | Discharge: 2021-12-29 | Disposition: A | Discharge status: Home / Self Care | Payer: BC Managed Care – PPO | Attending: Family Medicine | Admitting: Family Medicine

## 2021-12-29 ENCOUNTER — Encounter: Payer: Self-pay | Admitting: Emergency Medicine

## 2021-12-29 DIAGNOSIS — J069 Acute upper respiratory infection, unspecified: Secondary | ICD-10-CM | POA: Insufficient documentation

## 2021-12-29 DIAGNOSIS — Z1152 Encounter for screening for COVID-19: Secondary | ICD-10-CM | POA: Insufficient documentation

## 2021-12-29 DIAGNOSIS — H1033 Unspecified acute conjunctivitis, bilateral: Secondary | ICD-10-CM | POA: Diagnosis present

## 2021-12-29 LAB — RESP PANEL BY RT-PCR (FLU A&B, COVID) ARPGX2
Influenza A by PCR: NEGATIVE
Influenza B by PCR: NEGATIVE
SARS Coronavirus 2 by RT PCR: NEGATIVE

## 2021-12-29 MED ORDER — PROMETHAZINE-DM 6.25-15 MG/5ML PO SYRP: 5.0000 mL | ORAL_SOLUTION | Freq: Four times a day (QID) | ORAL | 0 refills | Status: DC | PRN

## 2021-12-29 MED ORDER — POLYMYXIN B-TRIMETHOPRIM 10000-0.1 UNIT/ML-% OP SOLN: 2.0000 [drp] | OPHTHALMIC | 0 refills | Status: DC

## 2021-12-29 NOTE — ED Triage Notes (Signed)
Pt presents with a cough x 2 days and bilateral eye redness and drainage since this morning.

## 2021-12-29 NOTE — ED Provider Notes (Signed)
[facility]    CSN: [id number] Arrival date & time: 12/29/21  1536      History   Chief Complaint Chief Complaint  Tracy Villarreal presents with   Cough   Conjunctivitis    HPI Tracy Villarreal is a 51 y.o. male.   HPI   Tracy Villarreal presents for bilateral eye redness and frequent drainage throughout the day today.  States he woke up this morning with his eyes crusted over and had to pry them open.  He does not have any small children or pets.  He has also been coughing for the past 2 days, has nasal congestion that is keeping him up at night.  Denies any fever, chills, nausea, vomiting, diarrhea, belly pain, chest pain, shortness of breath and rash.  Endorses headache.     Past Medical History:  Diagnosis Date   Allergies    Arthritis    BIL FEET   Collapse of right lung    YEARS AGO   GERD (gastroesophageal reflux disease)    RARE-NO MEDS   Gout    History of kidney stones    H/O    Tracy Villarreal Active Problem List   Diagnosis Date Noted   Umbilical hernia without obstruction and without gangrene    Gout 02/17/2016   Hearing loss 02/17/2016    Past Surgical History:  Procedure Laterality Date   BIOPSY  07/17/2021   Procedure: BIOPSY;  Surgeon: Ronnette Juniper, MD;  Location: [facility];  Service: Gastroenterology;;   ESOPHAGOGASTRODUODENOSCOPY (EGD) WITH PROPOFOL N/A 07/17/2021   Procedure: ESOPHAGOGASTRODUODENOSCOPY (EGD) WITH PROPOFOL;  Surgeon: Ronnette Juniper, MD;  Location: [facility];  Service: Gastroenterology;  Laterality: N/A;   NO PAST SURGERIES     UMBILICAL HERNIA REPAIR N/A 12/09/2017   Procedure: HERNIA REPAIR UMBILICAL ADULT;  Surgeon: Vickie Epley, MD;  Location: [facility];  Service: Vascular;  Laterality: N/A;       Home Medications    Prior to Admission medications   Medication Sig Start Date End Date Taking? Authorizing Provider  amLODipine (NORVASC) 5 MG tablet Take by mouth.   Yes [provider]  cetirizine (ZYRTEC)  10 MG tablet Take 10 mg by mouth daily.   Yes [provider]  pantoprazole (PROTONIX) 40 MG tablet Take 1 tablet (40 mg total) by mouth daily. 07/17/21 07/17/22 Yes Ronnette Juniper, MD  trimethoprim-polymyxin b (POLYTRIM) ophthalmic solution Place 2 drops into both eyes every 4 (four) hours. 12/29/21  Yes Trevonte Ashkar, DO  colchicine 0.6 MG tablet Take 2 tablets by mouth once. Then 1 tablet at least 1 hour later. Starting the next day you can take 1 tab PO up to BID PRN. 04/16/21   Brunetta Jeans, PA-C  famotidine (PEPCID) 20 MG tablet Take 20 mg by mouth 2 (two) times daily.    [provider]  fluticasone (FLONASE) 50 MCG/ACT nasal spray Place 1 spray into both nostrils daily.  11/28/17   [provider]  predniSONE (STERAPRED UNI-PAK 21 TAB) 10 MG (21) TBPK tablet Take 6 tablets on day 1, 5 tablets day 2, 4 tablets day 3, 3 tablets day 4, 2 tablets day 5, 1 tablet day 6 08/21/21   Margarette Canada, NP  sucralfate (CARAFATE) 1 g tablet Take 1 g by mouth 4 (four) times daily. 08/31/21   [provider]    Family History Family History  Problem Relation Age of Onset   Skin cancer Mother    Cancer Sister    Cancer Paternal  Grandfather    Prostate cancer Neg Hx    Bladder Cancer Neg Hx    Kidney cancer Neg Hx     Social History Social History   Tobacco Use   Smoking status: Never   Smokeless tobacco: Never  Vaping Use   Vaping Use: Never used  Substance Use Topics   Alcohol use: Yes    Alcohol/week: 8.0 standard drinks of alcohol    Types: 8 Shots of liquor per week    Comment: OCC   Drug use: No     Allergies   Tracy Villarreal has no known allergies.   Review of Systems Review of Systems: negative unless otherwise stated in HPI.      Physical Exam Triage Vital Signs ED Triage Vitals  Enc Vitals Group     BP 12/29/21 1715 (!) 154/97     Pulse Rate 12/29/21 1715 89     Resp 12/29/21 1715 16     Temp 12/29/21 1715 98 F (36.7 C)     Temp Source  12/29/21 1715 Oral     SpO2 12/29/21 1715 98 %     Weight --      Height --      Head Circumference --      Peak Flow --      Pain Score 12/29/21 1713 0     Pain Loc --      Pain Edu? --      Excl. in GC? --    No data found.  Updated Vital Signs BP (!) 154/97 (BP Location: Right Arm)   Pulse 89   Temp 98 F (36.7 C) (Oral)   Resp 16   SpO2 98%   Visual Acuity Right Eye Distance:   Left Eye Distance:   Bilateral Distance:    Right Eye Near:   Left Eye Near:    Bilateral Near:     Physical Exam GEN:     alert, non-toxic appearing male in no distress     HENT:  mucus membranes moist, oropharyngeal  without lesions or  exudate, no  tonsillar hypertrophy, moderate oropharyngeal erythema,   moderate erythematous edematous turbinates,  clear nasal discharge EYES:   pupils equal and reactive to light and accommodation, EOMi , bilateral scleral injection, no chemosis NECK:  normal ROM, no lymphadenopathy,  no meningismus   RESP:  no increased work of breathing,  clear to auscultation bilaterally CVS:   regular rate  and rhythm Skin:   warm and dry, no rash on visible skin     UC Treatments / Results  Labs (all labs ordered are listed, but only abnormal results are displayed) Labs Reviewed  RESP PANEL BY RT-PCR (FLU A&B, COVID) [id number]    EKG   Radiology No results found.  Procedures Procedures (including critical care time)  Medications Ordered in UC Medications - No data to display  Initial Impression / Assessment and Plan / UC Course  I have reviewed the triage vital signs and the nursing notes.  Pertinent labs & imaging results that were available during my care of the Tracy Villarreal were reviewed by me and considered in my medical decision making (see chart for details).       Pt is a 51 y.o. male who presents for 2 days of cough and onset bilateral eye redness and drainage. Tracy Villarreal is  afebrile here without recent antipyretics. Satting well on room air.  Overall pt is nontoxic appearing, well hydrated, without respiratory distress. Pulmonary exam  is unremarkable.  COVID  and influenza testing obtained.  Pt to quarantine until COVID test results or longer if positive.  I will call Tracy Villarreal with test results, if positive. History consistent with  viral respiratory illness. Discussed symptomatic treatment.  Explained lack of efficacy of antibiotics in viral disease.  Tracy Villarreal concerned that this may be bacterial discussed that this is bilateral most likely viral but will provide antibiotic drops for delayed use if not improving in the next couple of days.  Typical duration of symptoms discussed.  Return and ED precautions given and Tracy Villarreal voiced understanding.  Work note provided.  Discussed MDM, treatment plan and plan for follow-up with Tracy Villarreal who agrees with plan.     Final Clinical Impressions(s) / UC Diagnoses   Final diagnoses:  Viral URI with cough  Acute conjunctivitis of both eyes, unspecified acute conjunctivitis type     Discharge Instructions      I will contact you if your COVID or influenza test is positive.  Please quarantine while you wait for the results.  If your test is negative you may resume normal activities.  If your test is positive please continue to quarantine for at least 5 days from your symptom onset or until you are without a fever for at least 24 hours after the medications.   You can take Tylenol and/or Ibuprofen as needed for fever reduction and pain relief.    For cough: honey 1/2 to 1 teaspoon (you can dilute the honey in water or another fluid).  You can also use guaifenesin and dextromethorphan for cough. You can use a humidifier for chest congestion and cough.  If you don't have a humidifier, you can sit in the bathroom with the hot shower running.      For sore throat: try warm salt water gargles, cepacol lozenges, throat spray, warm tea or water with lemon/honey, popsicles or ice, or OTC cold relief  medicine for throat discomfort.    For congestion: take a daily anti-histamine like Zyrtec, Claritin, and a oral decongestant, such as pseudoephedrine.  You can also use Flonase 1-2 sprays in each nostril daily.    It is important to stay hydrated: drink plenty of fluids (water, gatorade/powerade/pedialyte, juices, or teas) to keep your throat moisturized and help further relieve irritation/discomfort.    Return or go to the Emergency Department if symptoms worsen or do not improve in the next few days       ED Prescriptions     Medication Sig Dispense Auth. Provider   trimethoprim-polymyxin b (POLYTRIM) ophthalmic solution Place 2 drops into both eyes every 4 (four) hours. 10 mL Katha Cabal, DO      PDMP not reviewed this encounter.   Katha Cabal, DO 12/29/21 1801

## 2021-12-29 NOTE — Discharge Instructions (Addendum)
I will contact you if your COVID or influenza test is positive.  Please quarantine while you wait for the results.  If your test is negative you may resume normal activities.  If your test is positive please continue to quarantine for at least 5 days from your symptom onset or until you are without a fever for at least 24 hours after the medications.   You can take Tylenol and/or Ibuprofen as needed for fever reduction and pain relief.    For cough: honey 1/2 to 1 teaspoon (you can dilute the honey in water or another fluid).  You can also use guaifenesin and dextromethorphan for cough. You can use a humidifier for chest congestion and cough.  If you don't have a humidifier, you can sit in the bathroom with the hot shower running.      For sore throat: try warm salt water gargles, cepacol lozenges, throat spray, warm tea or water with lemon/honey, popsicles or ice, or OTC cold relief medicine for throat discomfort.    For congestion: take a daily anti-histamine like Zyrtec, Claritin, and a oral decongestant, such as pseudoephedrine.  You can also use Flonase 1-2 sprays in each nostril daily.    It is important to stay hydrated: drink plenty of fluids (water, gatorade/powerade/pedialyte, juices, or teas) to keep your throat moisturized and help further relieve irritation/discomfort.    Return or go to the Emergency Department if symptoms worsen or do not improve in the next few days

## 2021-12-31 ENCOUNTER — Telehealth: Payer: BC Managed Care – PPO | Admitting: Physician Assistant

## 2021-12-31 DIAGNOSIS — J208 Acute bronchitis due to other specified organisms: Secondary | ICD-10-CM

## 2021-12-31 MED ORDER — PREDNISONE 10 MG (21) PO TBPK: ORAL_TABLET | ORAL | 0 refills | Status: DC

## 2021-12-31 MED ORDER — BENZONATATE 100 MG PO CAPS
100.0000 mg | ORAL_CAPSULE | Freq: Three times a day (TID) | ORAL | 0 refills | Status: DC | PRN
Start: 2021-12-31 — End: 2022-06-27

## 2021-12-31 NOTE — Progress Notes (Signed)
We are sorry that you are not feeling well.  Here is how we plan to help!  Based on your presentation I believe you most likely have A cough due to a virus.  This is called viral bronchitis and is best treated by rest, plenty of fluids and control of the cough.  You may use Ibuprofen or Tylenol as directed to help your symptoms.     In addition you may use A non-prescription cough medication called Mucinex: take 2 tablets every 12 hours. and A prescription cough medication called Tessalon Perles 100mg . You may take 1-2 capsules every 8 hours as needed for your cough. Can take this with Promethazine DM.  Prednisone 10 mg daily for 6 days (see taper instructions below)  Directions for 6 day taper: Day 1: 2 tablets before breakfast, 1 after both lunch & dinner and 2 at bedtime Day 2: 1 tab before breakfast, 1 after both lunch & dinner and 2 at bedtime Day 3: 1 tab at each meal & 1 at bedtime Day 4: 1 tab at breakfast, 1 at lunch, 1 at bedtime Day 5: 1 tab at breakfast & 1 tab at bedtime Day 6: 1 tab at breakfast  From your responses in the eVisit questionnaire you describe inflammation in the upper respiratory tract which is causing a significant cough.  This is commonly called Bronchitis and has four common causes:   Allergies Viral Infections Acid Reflux Bacterial Infection Allergies, viruses and acid reflux are treated by controlling symptoms or eliminating the cause. An example might be a cough caused by taking certain blood pressure medications. You stop the cough by changing the medication. Another example might be a cough caused by acid reflux. Controlling the reflux helps control the cough.  USE OF BRONCHODILATOR ("RESCUE") INHALERS: There is a risk from using your bronchodilator too frequently.  The risk is that over-reliance on a medication which only relaxes the muscles surrounding the breathing tubes can reduce the effectiveness of medications prescribed to reduce swelling and  congestion of the tubes themselves.  Although you feel brief relief from the bronchodilator inhaler, your asthma may actually be worsening with the tubes becoming more swollen and filled with mucus.  This can delay other crucial treatments, such as oral steroid medications. If you need to use a bronchodilator inhaler daily, several times per day, you should discuss this with your provider.  There are probably better treatments that could be used to keep your asthma under control.     HOME CARE Only take medications as instructed by your medical team. Complete the entire course of an antibiotic. Drink plenty of fluids and get plenty of rest. Avoid close contacts especially the very young and the elderly Cover your mouth if you cough or cough into your sleeve. Always remember to wash your hands A steam or ultrasonic humidifier can help congestion.   GET HELP RIGHT AWAY IF: You develop worsening fever. You become short of breath You cough up blood. Your symptoms persist after you have completed your treatment plan MAKE SURE YOU  Understand these instructions. Will watch your condition. Will get help right away if you are not doing well or get worse.    Thank you for choosing an e-visit.  Your e-visit answers were reviewed by a board certified advanced clinical practitioner to complete your personal care plan. Depending upon the condition, your plan could have included both over the counter or prescription medications.  Please review your pharmacy choice. Make sure the pharmacy  is open so you can pick up prescription now. If there is a problem, you may contact your provider through [employer] and have the prescription routed to another pharmacy.  Your safety is important to [place]. If you have drug allergies check your prescription carefully.   For the next 24 hours you can use MyChart to ask questions about today's visit, request a non-urgent call back, or ask for a work or school  excuse. You will get an email in the next two days asking about your experience. I hope that your e-visit has been valuable and will speed your recovery.  I have spent 5 minutes in review of e-visit questionnaire, review and updating patient chart, medical decision making and response to patient.   Margaretann Loveless, PA-C

## 2022-06-27 ENCOUNTER — Ambulatory Visit (INDEPENDENT_AMBULATORY_CARE_PROVIDER_SITE_OTHER): Payer: BC Managed Care – PPO

## 2022-06-27 ENCOUNTER — Ambulatory Visit
Admission: EM | Admit: 2022-06-27 | Discharge: 2022-06-27 | Disposition: A | Discharge status: Home / Self Care | Payer: BC Managed Care – PPO | Attending: Family Medicine | Admitting: Family Medicine

## 2022-06-27 ENCOUNTER — Encounter: Payer: Self-pay | Admitting: Emergency Medicine

## 2022-06-27 DIAGNOSIS — S2232XA Fracture of one rib, left side, initial encounter for closed fracture: Secondary | ICD-10-CM | POA: Diagnosis not present

## 2022-06-27 MED ORDER — HYDROCODONE-ACETAMINOPHEN 5-325 MG PO TABS: 1.0000 | ORAL_TABLET | Freq: Four times a day (QID) | ORAL | 0 refills | Status: DC | PRN

## 2022-06-27 NOTE — Discharge Instructions (Signed)
Your x-ray shows that you have a fracture to the seventh rib on your left side.  You may use Norco as needed for pain.  Please note this medication contains Tylenol.  Do not take any additional Tylenol over-the-counter while you are taking this medication.  You may take ibuprofen if needed Splinting to the area when you need to cough or sneeze or move Please follow-up with your PCP in 2 days. Please go to the emergency room ASAP if you have any worsening symptoms.  This includes but is not limited to coughing up blood, uncontrolled pain or swelling, shortness of breath, or any new concerns that arise I hope you feel better soon!

## 2022-06-27 NOTE — ED Triage Notes (Signed)
Patient states that he was up under the car fixing it and when he leaned over and put all his weight on the left side he felt a pop in his left ribcage on Thursday.  Patient c/o ongoing left sided rib pain.  Patient needs a work note.

## 2022-06-27 NOTE — ED Provider Notes (Signed)
[facility]    CSN: [id number] Arrival date & time: 06/27/22  0801      History   Chief Complaint Chief Complaint  Patient presents with   Rib Injury    left    HPI Tracy Villarreal is a 52 y.o. male presents for evaluation of rib pain. Pt reports 4 days ago he was working on his car and leaned onto his left side, heard a "pop" in his head left lateral to anterior rib pain since that time.  States the pain is sharp is keeping him from sleeping.  Denies any swelling, bruising, hemoptysis, shortness of breath.  States he has a history of fractures but is not sure what side it was on.  He has been taking ibuprofen and sleeping pills without improvement.  HPI  Past Medical History:  Diagnosis Date   Allergies    Arthritis    BIL FEET   Collapse of right lung    YEARS AGO   GERD (gastroesophageal reflux disease)    RARE-NO MEDS   Gout    History of kidney stones    H/O    Patient Active Problem List   Diagnosis Date Noted   Umbilical hernia without obstruction and without gangrene    Gout 02/17/2016   Hearing loss 02/17/2016    Past Surgical History:  Procedure Laterality Date   BIOPSY  07/17/2021   Procedure: BIOPSY;  Surgeon: Kerin Salen, MD;  Location: [facility];  Service: Gastroenterology;;   ESOPHAGOGASTRODUODENOSCOPY (EGD) WITH PROPOFOL N/A 07/17/2021   Procedure: ESOPHAGOGASTRODUODENOSCOPY (EGD) WITH PROPOFOL;  Surgeon: Kerin Salen, MD;  Location: [facility];  Service: Gastroenterology;  Laterality: N/A;   NO PAST SURGERIES     UMBILICAL HERNIA REPAIR N/A 12/09/2017   Procedure: HERNIA REPAIR UMBILICAL ADULT;  Surgeon: Ancil Linsey, MD;  Location: [facility];  Service: Vascular;  Laterality: N/A;       Home Medications    Prior to Admission medications   Medication Sig Start Date End Date Taking? Authorizing Provider  amLODipine (NORVASC) 5 MG tablet Take by mouth.   Yes [provider]  HYDROcodone-acetaminophen  (NORCO/VICODIN) 5-325 MG tablet Take 1 tablet by mouth every 6 (six) hours as needed. 06/27/22  Yes Radford Pax, NP  pantoprazole (PROTONIX) 40 MG tablet Take 1 tablet (40 mg total) by mouth daily. 07/17/21 07/17/22 Yes Kerin Salen, MD  cetirizine (ZYRTEC) 10 MG tablet Take 10 mg by mouth daily.    [provider]  colchicine 0.6 MG tablet Take 2 tablets by mouth once. Then 1 tablet at least 1 hour later. Starting the next day you can take 1 tab PO up to BID PRN. 04/16/21   Waldon Merl, PA-C  famotidine (PEPCID) 20 MG tablet Take 20 mg by mouth 2 (two) times daily.    [provider]  fluticasone (FLONASE) 50 MCG/ACT nasal spray Place 1 spray into both nostrils daily.  11/28/17   [provider]  predniSONE (STERAPRED UNI-PAK 21 TAB) 10 MG (21) TBPK tablet Take 6 tablets on day 1, 5 tablets day 2, 4 tablets day 3, 3 tablets day 4, 2 tablets day 5, 1 tablet day 6 12/31/21   Margaretann Loveless, PA-C  promethazine-dextromethorphan (PROMETHAZINE-DM) 6.25-15 MG/5ML syrup Take 5 mLs by mouth 4 (four) times daily as needed. 12/29/21   Katha Cabal, DO  trimethoprim-polymyxin b (POLYTRIM) ophthalmic solution Place 2 drops into both eyes every 4 (four) hours. 12/29/21   Katha Cabal, DO  Family History Family History  Problem Relation Age of Onset   Skin cancer Mother    Cancer Sister    Cancer Paternal Grandfather    Prostate cancer Neg Hx    Bladder Cancer Neg Hx    Kidney cancer Neg Hx     Social History Social History   Tobacco Use   Smoking status: Never   Smokeless tobacco: Never  Vaping Use   Vaping Use: Never used  Substance Use Topics   Alcohol use: Yes    Alcohol/week: 8.0 standard drinks of alcohol    Types: 8 Shots of liquor per week    Comment: OCC   Drug use: No     Allergies   Patient has no known allergies.   Review of Systems Review of Systems  Musculoskeletal:        Left rib pain     Physical Exam Triage Vital  Signs ED Triage Vitals  Enc Vitals Group     BP 06/27/22 0824 (!) 165/104     Pulse Rate 06/27/22 0824 99     Resp 06/27/22 0824 15     Temp 06/27/22 0824 98.4 F (36.9 C)     Temp Source 06/27/22 0824 Oral     SpO2 06/27/22 0824 96 %     Weight 06/27/22 0821 200 lb (90.7 kg)     Height 06/27/22 0821 5\' 10"  (1.778 m)     Head Circumference --      Peak Flow --      Pain Score 06/27/22 0821 6     Pain Loc --      Pain Edu? --      Excl. in GC? --    No data found.  Updated Vital Signs BP (!) 165/104 (BP Location: Right Arm)   Pulse 99   Temp 98.4 F (36.9 C) (Oral)   Resp 15   Ht 5\' 10"  (1.778 m)   Wt 200 lb (90.7 kg)   SpO2 96%   BMI 28.70 kg/m   Visual Acuity Right Eye Distance:   Left Eye Distance:   Bilateral Distance:    Right Eye Near:   Left Eye Near:    Bilateral Near:     Physical Exam Vitals and nursing note reviewed.  Constitutional:      General: He is not in acute distress.    Appearance: Normal appearance. He is not ill-appearing.  HENT:     Head: Normocephalic and atraumatic.  Eyes:     Pupils: Pupils are equal, round, and reactive to light.  Cardiovascular:     Rate and Rhythm: Normal rate.  Pulmonary:     Effort: Pulmonary effort is normal. No respiratory distress.     Breath sounds: Normal breath sounds. No stridor. No wheezing, rhonchi or rales.     Comments: Equal chest expansion bilaterally  Chest:       Comments: No swelling or ecchymosis of the left ribs.  Tender to palpation to the lower mid lateral ribs on the left that does extend to lower anterior ribs.  No deformity.  Skin:    General: Skin is warm and dry.  Neurological:     General: No focal deficit present.     Mental Status: He is alert and oriented to person, place, and time.  Psychiatric:        Mood and Affect: Mood normal.        Behavior: Behavior normal.      UC Treatments / Results  Labs (all labs ordered are listed, but only abnormal results are  displayed) Labs Reviewed - No data to display  EKG   Radiology DG Ribs Unilateral W/Chest Left  Result Date: 06/27/2022 CLINICAL DATA:  Injured left ribs.  Persistent pain. EXAM: LEFT RIBS AND CHEST - 3+ VIEW COMPARISON:  Chest x-ray 04/02/2021 FINDINGS: The cardiac silhouette, mediastinal and hilar contours are normal. The lungs are clear. No pleural effusion or pneumothorax. No pulmonary lesions. There is a remote healed left eighth rib fracture. Subtle nondisplaced acute fracture involving the anterior aspect of the left seventh rib. IMPRESSION: 1. No acute cardiopulmonary findings. 2. Remote healed left eighth rib fracture. 3. Acute nondisplaced anterior seventh rib fracture. Electronically Signed   By: Rudie Meyer M.D.   On: 06/27/2022 09:07    Procedures Procedures (including critical care time)  Medications Ordered in UC Medications - No data to display  Initial Impression / Assessment and Plan / UC Course  I have reviewed the triage vital signs and the nursing notes.  Pertinent labs & imaging results that were available during my care of the patient were reviewed by me and considered in my medical decision making (see chart for details).     Reviewed x-ray and exam and symptoms with patient.  He is well-appearing and in no acute distress Rx Norco provided.  Side effect profile reviewed.  PDMP reviewed Discussed splinting Work note provided Strict ER precautions reviewed and patient verbalized understanding He is to follow-up with his PCP in 2 days for recheck Final Clinical Impressions(s) / UC Diagnoses   Final diagnoses:  Closed fracture of one rib of left side, initial encounter     Discharge Instructions      Your x-ray shows that you have a fracture to the seventh rib on your left side.  You may use Norco as needed for pain.  Please note this medication contains Tylenol.  Do not take any additional Tylenol over-the-counter while you are taking this medication.   You may take ibuprofen if needed Splinting to the area when you need to cough or sneeze or move Please follow-up with your PCP in 2 days. Please go to the emergency room ASAP if you have any worsening symptoms.  This includes but is not limited to coughing up blood, uncontrolled pain or swelling, shortness of breath, or any new concerns that arise I hope you feel better soon!     ED Prescriptions     Medication Sig Dispense Auth. Provider   HYDROcodone-acetaminophen (NORCO/VICODIN) 5-325 MG tablet Take 1 tablet by mouth every 6 (six) hours as needed. 15 tablet Radford Pax, NP      I have reviewed the PDMP during this encounter.   Radford Pax, NP 06/27/22 [phone]

## 2023-02-13 ENCOUNTER — Ambulatory Visit
Admission: EM | Admit: 2023-02-13 | Discharge: 2023-02-13 | Disposition: A | Discharge status: Home / Self Care | Payer: BC Managed Care – PPO | Attending: Physician Assistant | Admitting: Physician Assistant

## 2023-02-13 ENCOUNTER — Encounter: Payer: Self-pay | Admitting: Emergency Medicine

## 2023-02-13 ENCOUNTER — Ambulatory Visit: Payer: BC Managed Care – PPO

## 2023-02-13 DIAGNOSIS — M25512 Pain in left shoulder: Secondary | ICD-10-CM

## 2023-02-13 DIAGNOSIS — M75102 Unspecified rotator cuff tear or rupture of left shoulder, not specified as traumatic: Secondary | ICD-10-CM | POA: Diagnosis not present

## 2023-02-13 MED ORDER — NAPROXEN 500 MG PO TABS: 500.0000 mg | ORAL_TABLET | Freq: Two times a day (BID) | ORAL | 0 refills | Status: DC

## 2023-02-13 MED ORDER — TIZANIDINE HCL 4 MG PO TABS: 4.0000 mg | ORAL_TABLET | Freq: Every day | ORAL | 0 refills | Status: AC

## 2023-02-13 NOTE — Discharge Instructions (Signed)
-  X-ray was normal but as discussed you cannot see the rotator cuff.  I believe you tore your rotator cuff.  There are partial and complete tears. - I sent anti-inflammatory medication to the pharmacy and a muscle relaxer for you to take at night.  Avoid any painful activities and contact orthopedics and physical therapy as soon as possible to make appointments.  You need an MRI and to get into PT.  You have a condition requiring you to follow up with Orthopedics so please call one of the following office for appointment:   [facility] Address: [address] [place], [place] [address] Phone: [phone]  [facility] [address], [place], [place] [address] Phone: [phone]  [facility] [address], [place], [place] [address] Phone: [phone]   [facility] [address] [place], [place] [address] Phone: [phone]

## 2023-02-13 NOTE — ED Provider Notes (Signed)
[facility]    CSN: [id number] Arrival date & time: 02/13/23  1444      History   Chief Complaint Chief Complaint  Patient presents with   Shoulder Pain    left    HPI Tracy Villarreal is a 52 y.o. male presenting for left shoulder pain.  He reports approximately a month ago he was lifting a large piece of equipment and felt something pop in his shoulder.  He reports symptoms have gotten worse instead of better.  Reports increased pain when trying to raise the arm or pick anything up.  Pain does not radiate down arm.  No numbness, weakness or tingling.  Has been topical patches but not taking anything orally.  He is right-handed.  No history of shoulder problems.  HPI  Past Medical History:  Diagnosis Date   Allergies    Arthritis    BIL FEET   Collapse of right lung    YEARS AGO   GERD (gastroesophageal reflux disease)    RARE-NO MEDS   Gout    History of kidney stones    H/O    Patient Active Problem List   Diagnosis Date Noted   Umbilical hernia without obstruction and without gangrene    Gout 02/17/2016   Hearing loss 02/17/2016    Past Surgical History:  Procedure Laterality Date   BIOPSY  07/17/2021   Procedure: BIOPSY;  Surgeon: Kerin Salen, MD;  Location: [facility];  Service: Gastroenterology;;   ESOPHAGOGASTRODUODENOSCOPY (EGD) WITH PROPOFOL N/A 07/17/2021   Procedure: ESOPHAGOGASTRODUODENOSCOPY (EGD) WITH PROPOFOL;  Surgeon: Kerin Salen, MD;  Location: [facility];  Service: Gastroenterology;  Laterality: N/A;   NO PAST SURGERIES     UMBILICAL HERNIA REPAIR N/A 12/09/2017   Procedure: HERNIA REPAIR UMBILICAL ADULT;  Surgeon: Ancil Linsey, MD;  Location: [facility];  Service: Vascular;  Laterality: N/A;       Home Medications    Prior to Admission medications   Medication Sig Start Date End Date Taking? Authorizing Provider  amLODipine (NORVASC) 5 MG tablet Take by mouth.   Yes [provider]  cetirizine (ZYRTEC) 10  MG tablet Take 10 mg by mouth daily.   Yes [provider]  famotidine (PEPCID) 20 MG tablet Take 20 mg by mouth 2 (two) times daily.   Yes [provider]  fluticasone (FLONASE) 50 MCG/ACT nasal spray Place 1 spray into both nostrils daily.  11/28/17  Yes [provider]  naproxen (NAPROSYN) 500 MG tablet Take 1 tablet (500 mg total) by mouth 2 (two) times daily. 02/13/23  Yes Eusebio Friendly B, PA-C  tiZANidine (ZANAFLEX) 4 MG tablet Take 1 tablet (4 mg total) by mouth at bedtime for 15 days. 02/13/23 02/28/23 Yes Eusebio Friendly B, PA-C  colchicine 0.6 MG tablet Take 2 tablets by mouth once. Then 1 tablet at least 1 hour later. Starting the next day you can take 1 tab PO up to BID PRN. 04/16/21   Waldon Merl, PA-C  HYDROcodone-acetaminophen (NORCO/VICODIN) 5-325 MG tablet Take 1 tablet by mouth every 6 (six) hours as needed. 06/27/22   Radford Pax, NP  pantoprazole (PROTONIX) 40 MG tablet Take 1 tablet (40 mg total) by mouth daily. 07/17/21 07/17/22  Kerin Salen, MD  predniSONE (STERAPRED UNI-PAK 21 TAB) 10 MG (21) TBPK tablet Take 6 tablets on day 1, 5 tablets day 2, 4 tablets day 3, 3 tablets day 4, 2 tablets day 5, 1 tablet day 6 12/31/21   Burnette,  Alessandra Bevels, PA-C  promethazine-dextromethorphan (PROMETHAZINE-DM) 6.25-15 MG/5ML syrup Take 5 mLs by mouth 4 (four) times daily as needed. 12/29/21   Katha Cabal, DO  trimethoprim-polymyxin b (POLYTRIM) ophthalmic solution Place 2 drops into both eyes every 4 (four) hours. 12/29/21   Katha Cabal, DO    Family History Family History  Problem Relation Age of Onset   Skin cancer Mother    Cancer Sister    Cancer Paternal Grandfather    Prostate cancer Neg Hx    Bladder Cancer Neg Hx    Kidney cancer Neg Hx     Social History Social History   Tobacco Use   Smoking status: Never   Smokeless tobacco: Never  Vaping Use   Vaping status: Never Used  Substance Use Topics   Alcohol use: Yes    Alcohol/week: 8.0  standard drinks of alcohol    Types: 8 Shots of liquor per week    Comment: OCC   Drug use: No     Allergies   Patient has no known allergies.   Review of Systems Review of Systems  Musculoskeletal:  Positive for arthralgias. Negative for joint swelling, neck pain and neck stiffness.  Neurological:  Negative for weakness, numbness and headaches.     Physical Exam Triage Vital Signs ED Triage Vitals  Encounter Vitals Group     BP 02/13/23 1528 (!) 148/91     Systolic BP Percentile --      Diastolic BP Percentile --      Pulse Rate 02/13/23 1528 97     Resp 02/13/23 1528 16     Temp 02/13/23 1528 98.2 F (36.8 C)     Temp Source 02/13/23 1528 Oral     SpO2 02/13/23 1528 96 %     Weight 02/13/23 1527 200 lb (90.7 kg)     Height 02/13/23 1527 5\' 10"  (1.778 m)     Head Circumference --      Peak Flow --      Pain Score 02/13/23 1526 8     Pain Loc --      Pain Education --      Exclude from Growth Chart --    No data found.  Updated Vital Signs BP (!) 148/91 (BP Location: Left Arm)   Pulse 97   Temp 98.2 F (36.8 C) (Oral)   Resp 16   Ht 5\' 10"  (1.778 m)   Wt 200 lb (90.7 kg)   SpO2 96%   BMI 28.70 kg/m    Physical Exam Vitals and nursing note reviewed.  Constitutional:      General: He is not in acute distress.    Appearance: Normal appearance. He is well-developed. He is not ill-appearing.  HENT:     Head: Normocephalic and atraumatic.  Eyes:     General: No scleral icterus.    Conjunctiva/sclera: Conjunctivae normal.  Cardiovascular:     Rate and Rhythm: Normal rate.  Pulmonary:     Effort: Pulmonary effort is normal. No respiratory distress.  Musculoskeletal:     Cervical back: Neck supple.     Comments: Reduced range of motion of left shoulder to 90 degrees flexion and abduction.  Unable to perform liftoff test.  Positive drop arm test.  Positive speeds test.  No tenderness.  Skin:    General: Skin is warm and dry.     Capillary Refill:  Capillary refill takes less than 2 seconds.  Neurological:     General: No focal deficit present.  Mental Status: He is alert. Mental status is at baseline.     Motor: No weakness.     Gait: Gait normal.  Psychiatric:        Mood and Affect: Mood normal.        Behavior: Behavior normal.      UC Treatments / Results  Labs (all labs ordered are listed, but only abnormal results are displayed) Labs Reviewed - No data to display  EKG   Radiology DG Shoulder Left  Result Date: 02/13/2023 CLINICAL DATA:  Twisting injury 1 month ago with pain. EXAM: LEFT SHOULDER - 2+ VIEW COMPARISON:  None Available. FINDINGS: Visualized portion of the left hemithorax is normal. No acute fracture or dislocation. IMPRESSION: No acute osseous abnormality. Electronically Signed   By: Jeronimo Greaves M.D.   On: 02/13/2023 16:04    Procedures Procedures (including critical care time)  Medications Ordered in UC Medications - No data to display  Initial Impression / Assessment and Plan / UC Course  I have reviewed the triage vital signs and the nursing notes.  Pertinent labs & imaging results that were available during my care of the patient were reviewed by me and considered in my medical decision making (see chart for details).   52 year old male presents with left shoulder pain x 1 month.  Symptoms started after he lifted a large and heavy piece of equipment.  X-ray of left shoulder obtained.  X-ray negative.  Exam and symptoms are consistent with rotator cuff injury/tear.  Patient was advised to follow-up with orthopedics for MRI and begin physical therapy.  Information given for both.  Offered ketorolac injection in clinic but he declines.  Sent naproxen to pharmacy as well as tizanidine.  He states he has difficulty sleeping at night.  Hopefully this will help.  Encouraged him to avoid painful activities such as lifting/reaching overhead.   Final Clinical Impressions(s) / UC Diagnoses   Final  diagnoses:  Nontraumatic tear of left rotator cuff, unspecified tear extent  Acute pain of left shoulder     Discharge Instructions      -X-ray was normal but as discussed you cannot see the rotator cuff.  I believe you tore your rotator cuff.  There are partial and complete tears. - I sent anti-inflammatory medication to the pharmacy and a muscle relaxer for you to take at night.  Avoid any painful activities and contact orthopedics and physical therapy as soon as possible to make appointments.  You need an MRI and to get into PT.  You have a condition requiring you to follow up with Orthopedics so please call one of the following office for appointment:   [facility] Address: [address] [place], [place] [address] Phone: [phone]  [facility] [address], [place], [place] [address] Phone: [phone]  [facility] [address] [place], [place] [address] Phone: [phone]   [facility] [address], [place] [address] Phone: [phone]      ED Prescriptions     Medication Sig Dispense Auth. Provider   naproxen (NAPROSYN) 500 MG tablet Take 1 tablet (500 mg total) by mouth 2 (two) times daily. 30 tablet Eusebio Friendly B, PA-C   tiZANidine (ZANAFLEX) 4 MG tablet Take 1 tablet (4 mg total) by mouth at bedtime for 15 days. 15 tablet Gareth Morgan      PDMP not reviewed this encounter.   Shirlee Latch, PA-C 02/13/23 1624

## 2023-02-13 NOTE — ED Triage Notes (Signed)
Pt c/o left shoulder pain. He states about a month ago he was picking up a big piece of equipment and felt something pop. He states the shoulder has been getting worse.

## 2023-05-10 IMAGING — CT CT RENAL STONE PROTOCOL
2 of 4 series · 16 of 46 positions shown, 18 images · non-contrast
Comparison: 02/14/2016

CLINICAL DATA: Through a history of left lower quadrant pain.
Scrotal mass.

EXAM:
CT ABDOMEN AND PELVIS WITHOUT CONTRAST
TECHNIQUE: Multidetector CT imaging of the abdomen and pelvis was performed
following the standard protocol without IV contrast.

[Series 2: stone full standard · axial · 0.84mm/px · z∈[-497,-27]mm · 13 of 104 slices shown, 15 images]
[im 5/104  soft-tissue]
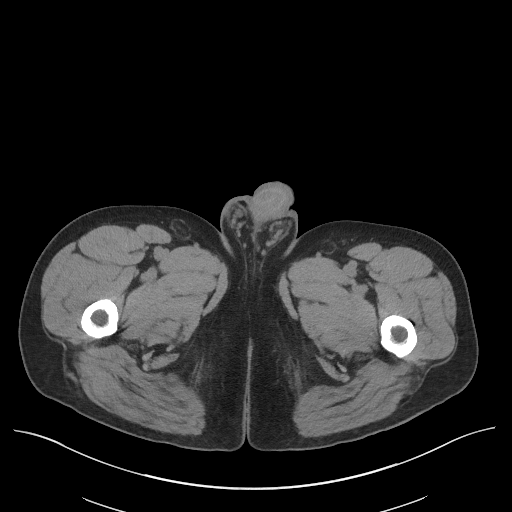
[im 5/104  bone]
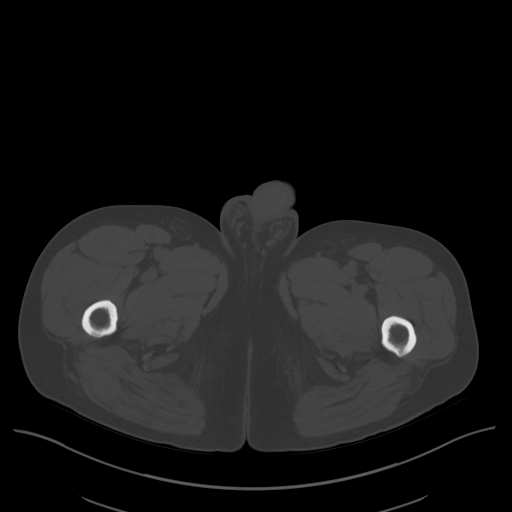
[im 13/104  soft-tissue]
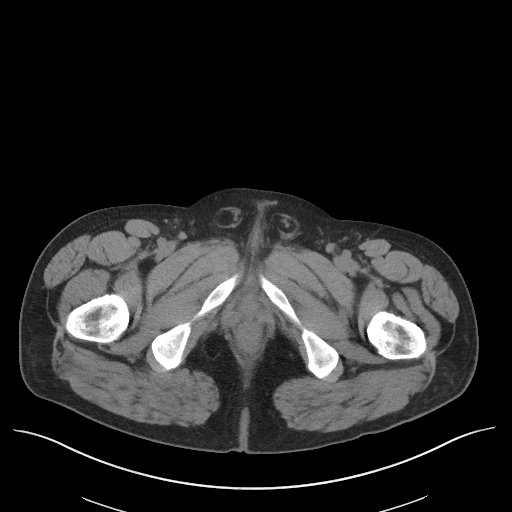
[im 21/104  soft-tissue]
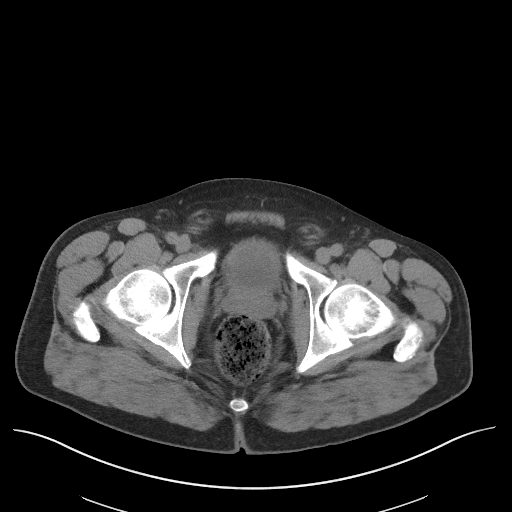
[im 29/104  soft-tissue]
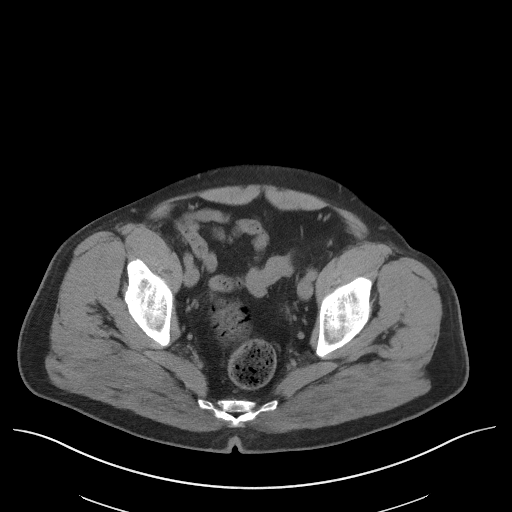
[im 38/104  soft-tissue]
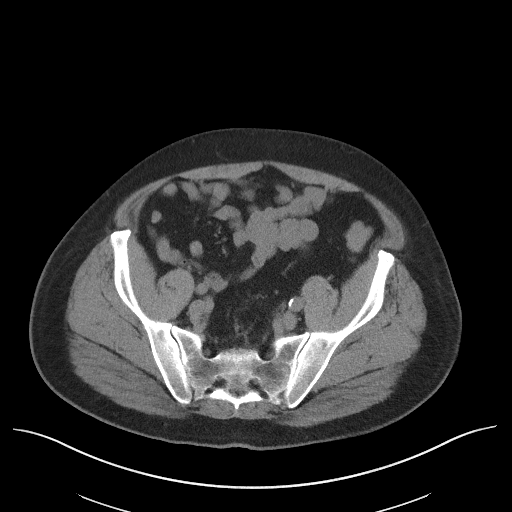
[im 46/104  soft-tissue]
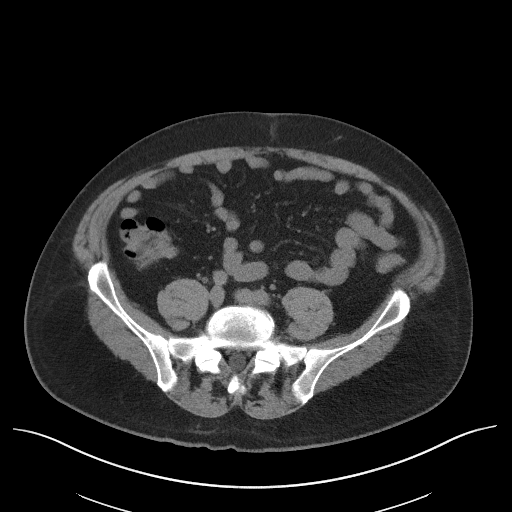
[im 54/104  soft-tissue]
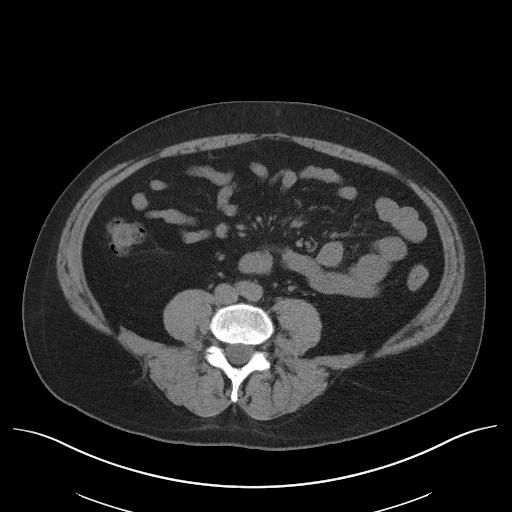
[im 58/104  soft-tissue]
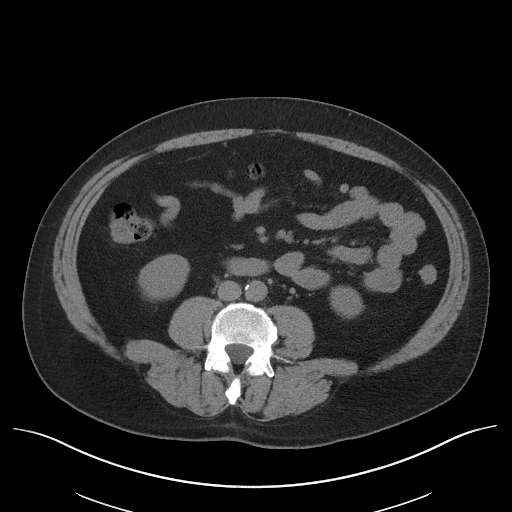
[im 66/104  soft-tissue]
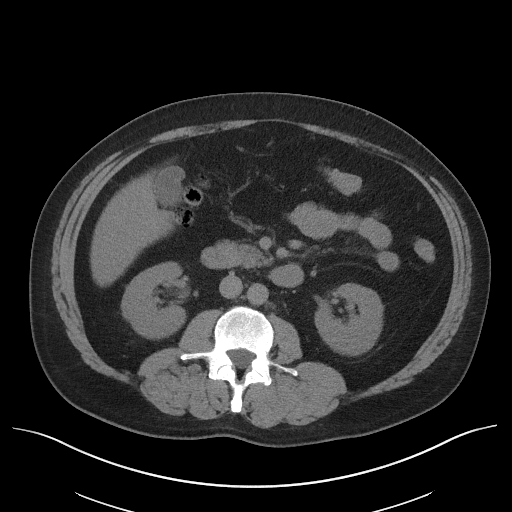
[im 66/104  bone]
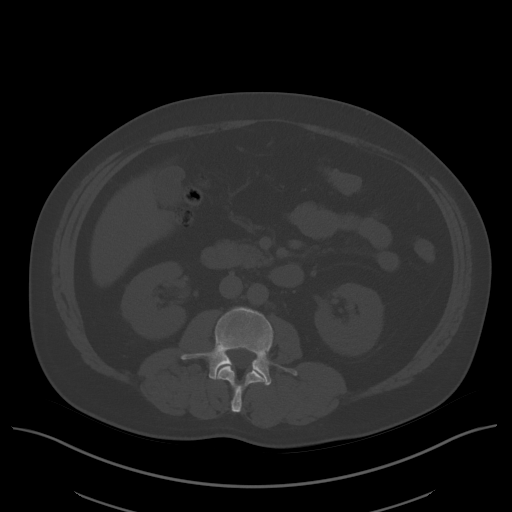
[im 75/104  soft-tissue]
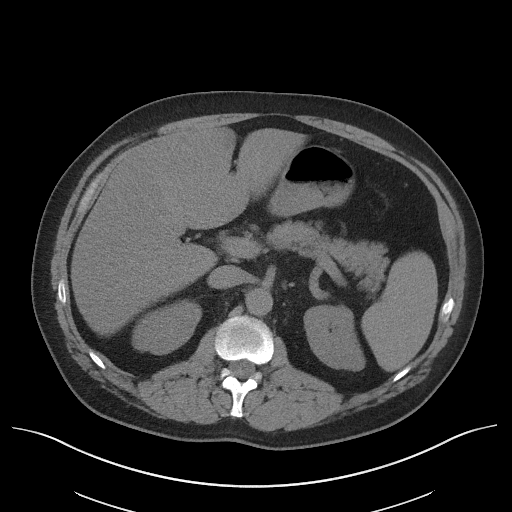
[im 83/104  soft-tissue]
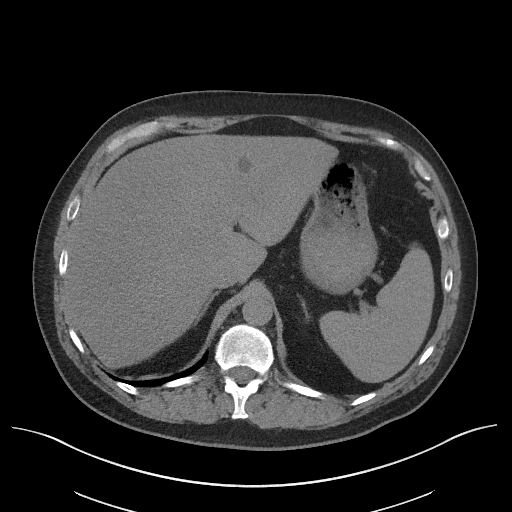
[im 91/104  soft-tissue]
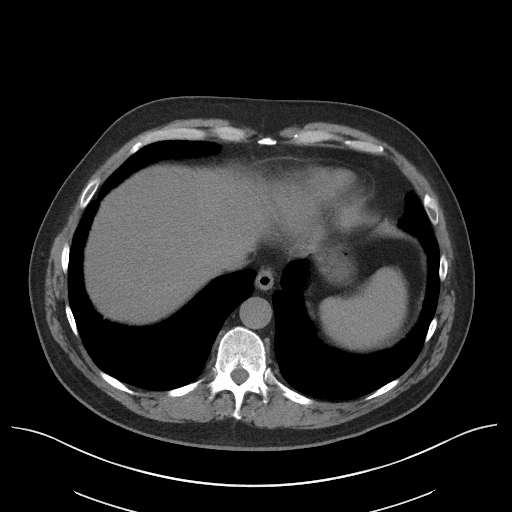
[im 99/104  soft-tissue]
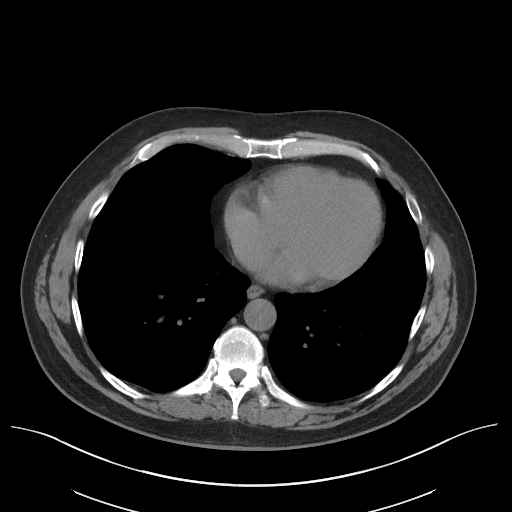

[Series 5: coronal · coronal · 0.79mm/px · 3 of 156 slices shown]
[im 52/156  soft-tissue]
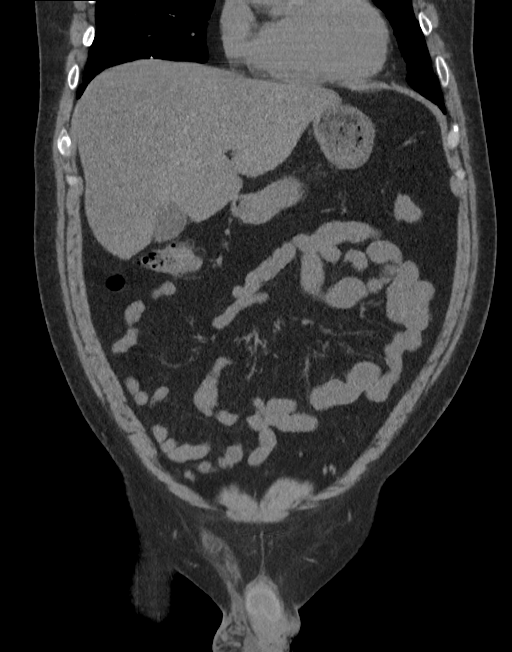
[im 69/156  soft-tissue]
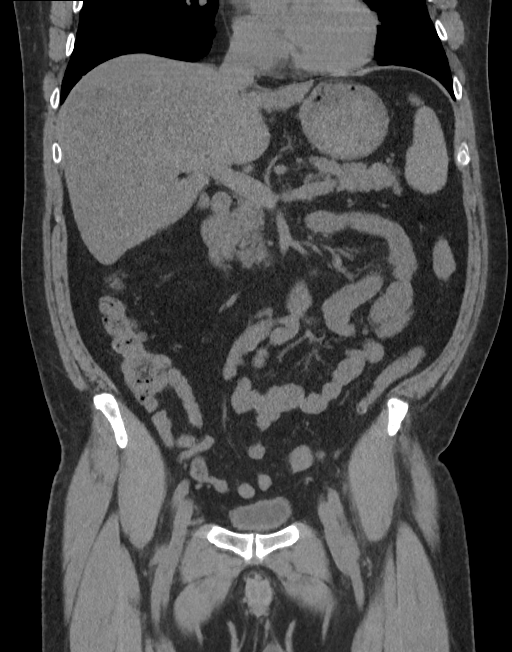
[im 87/156  soft-tissue]
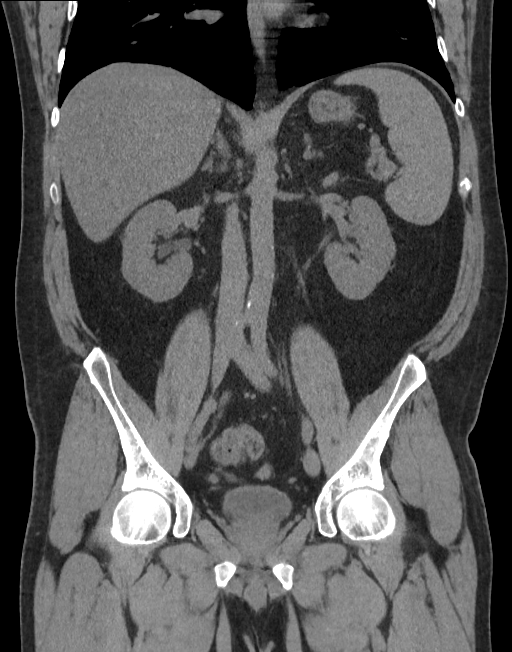

[16 of 46 positions shown; findings below may reference images not displayed]

FINDINGS: Lower chest: Unremarkable.

Hepatobiliary: The liver shows diffusely decreased attenuation
suggesting fat deposition. Small hypodensities identified in the
liver are too small to characterize but likely benign. Most of these
were present on the prior study. There is no evidence for
gallstones, gallbladder wall thickening, or pericholecystic fluid.
No intrahepatic or extrahepatic biliary dilation.

Pancreas: No focal mass lesion. No dilatation of the main duct. No
intraparenchymal cyst. No peripancreatic edema.

Spleen: No splenomegaly. No focal mass lesion.

Adrenals/Urinary Tract: No adrenal nodule or mass. Punctate
nonobstructing stone identified interpolar right kidney (coronal
image 97/series 5). No left renal stones. No ureteral or bladder
stones. No secondary changes in either kidney or ureter.

Stomach/Bowel: Stomach is unremarkable. No gastric wall thickening.
No evidence of outlet obstruction. Duodenum is normally positioned
as is the ligament of Treitz. No small bowel wall thickening. No
small bowel dilatation. The terminal ileum is normal. The appendix
is normal. No gross colonic mass. No colonic wall thickening.

Vascular/Lymphatic: There is mild atherosclerotic calcification of
the abdominal aorta without aneurysm. There is no gastrohepatic or
hepatoduodenal ligament lymphadenopathy. No retroperitoneal or
mesenteric lymphadenopathy. No pelvic sidewall lymphadenopathy.

Reproductive: The prostate gland and seminal vesicles are
unremarkable.

Other: No intraperitoneal free fluid.

Musculoskeletal: No worrisome lytic or sclerotic osseous
abnormality.
IMPRESSION: 1. No acute findings in the abdomen or pelvis. Specifically, no
findings to explain the patient's history of left lower quadrant
pain.
2. Punctate nonobstructing right renal stone.
3. Hepatic steatosis.
4. Aortic Atherosclerosis ([id number]).

## 2023-07-04 ENCOUNTER — Telehealth: Admitting: Family Medicine

## 2023-07-04 DIAGNOSIS — M10072 Idiopathic gout, left ankle and foot: Secondary | ICD-10-CM | POA: Diagnosis not present

## 2023-07-04 MED ORDER — PREDNISONE 20 MG PO TABS: 40.0000 mg | ORAL_TABLET | Freq: Every day | ORAL | 0 refills | Status: AC

## 2023-07-04 NOTE — Progress Notes (Signed)
E-Visit for Gout Symptoms  We are sorry that you are not feeling well. We are here to help!  Based on what you shared with me it looks like you have a flare of your gout.  Gout is a form of arthritis. It can cause pain and swelling in the joints. At first, it tends to affect only 1 joint - most frequently the big toe. It happens in people who have too much uric acid in the blood. Uric acid is a chemical that is produced when the body breaks down certain foods. Uric acid can form sharp needle-like crystals that build up in the joints and cause pain. Uric acid crystals can also form inside the tubes that carry urine from the kidneys to the bladder. These crystals can turn into "kidney stones" that can cause pain and problems with the flow of urine. People with gout get sudden "flares" or attacks of severe pain, most often the big toe, ankle, or knee. Often the joint also turns red and swells. Usually, only 1 joint is affected, but some people have pain in more than 1 joint. Gout flares tend to happen more often during the night.  The pain from gout can be extreme. The pain and swelling are worst at the beginning of a gout flare. The symptoms then get better within a few days to weeks. It is not clear how the body "turns off" a gout flare.  Do not start any NEW preventative medicine until the gout has cleared completely. However, If you are already on Probenecid or Allopurinol for CHRONIC gout, you may continue taking this during an active flare up  I have prescribed Prednisone 40 mg daily for 7   HOME CARE Losing weight can help relieve gout. It's not clear that following a specific diet plan will help with gout symptoms but eating a balanced diet can help improve your overall health. It can also help you lose weight, if you are overweight. In general, a healthy diet includes plenty of fruits, vegetables, whole grains, and low-fat dairy products (labelled "low fat", skim, 2%). Avoid sugar sweetened  drinks (including sodas, tea, juice and juice blends, coffee drinks and sports drinks) Limit alcohol to 1-2 drinks of beer, spirits or wine daily these can make gout flares worse. Some people with gout also have other health problems, such as heart disease, high blood pressure, kidney disease, or obesity. If you have any of these issues, it's important to work with your doctor to manage them. This can help improve your overall health and might also help with your gout.  GET HELP RIGHT AWAY IF: Your symptoms persist after you have completed your treatment plan You develop severe diarrhea You develop abnormal sensations  You develop vomiting,   You develop weakness  You develop abdominal pain  FOLLOW UP WITH YOUR PRIMARY PROVIDER IF: If your symptoms do not improve within 10 days  MAKE SURE YOU  Understand these instructions. Will watch your condition. Will get help right away if you are not doing well or get worse.  Thank you for choosing an e-visit.  Your e-visit answers were reviewed by a board certified advanced clinical practitioner to complete your personal care plan. Depending upon the condition, your plan could have included both over the counter or prescription medications.  Please review your pharmacy choice. Make sure the pharmacy is open so you can pick up prescription now. If there is a problem, you may contact your provider through MyChart messaging and have   the prescription routed to another pharmacy.  Your safety is important to us. If you have drug allergies check your prescription carefully.   For the next 24 hours you can use MyChart to ask questions about today's visit, request a non-urgent call back, or ask for a work or school excuse. You will get an email in the next two days asking about your experience. I hope that your e-visit has been valuable and will speed your recovery.  I have spent 5 minutes in review of e-visit questionnaire, review and updating patient  chart, medical decision making and response to patient.   Jamieson Hetland M Macyn Remmert, PA-C
